# Patient Record
Sex: Male | Born: 1970 | Race: White | Hispanic: No | Marital: Married | Smoking: Former smoker
Health system: Southern US, Community
[De-identification: ages and names within clinical notes are randomized; demographics above are authoritative.]

## PROBLEM LIST (undated history)

## (undated) DIAGNOSIS — I10 Essential (primary) hypertension: Secondary | ICD-10-CM

## (undated) DIAGNOSIS — F909 Attention-deficit hyperactivity disorder, unspecified type: Secondary | ICD-10-CM

## (undated) DIAGNOSIS — Z77090 Contact with and (suspected) exposure to asbestos: Secondary | ICD-10-CM

## (undated) HISTORY — DX: Essential (primary) hypertension: I10

## (undated) HISTORY — DX: Attention-deficit hyperactivity disorder, unspecified type: F90.9

## (undated) HISTORY — DX: Contact with and (suspected) exposure to asbestos: Z77.090

---

## 2003-08-24 ENCOUNTER — Inpatient Hospital Stay (HOSPITAL_COMMUNITY): Admission: EM | Admit: 2003-08-24 | Discharge: 2003-08-26 | Payer: Self-pay | Admitting: Emergency Medicine

## 2004-11-25 HISTORY — PX: HERNIA REPAIR: SHX51

## 2008-09-03 ENCOUNTER — Emergency Department (HOSPITAL_COMMUNITY): Admission: EM | Admit: 2008-09-03 | Discharge: 2008-09-03 | Payer: Self-pay | Admitting: Emergency Medicine

## 2009-02-11 ENCOUNTER — Emergency Department (HOSPITAL_COMMUNITY): Admission: EM | Admit: 2009-02-11 | Discharge: 2009-02-11 | Payer: Self-pay | Admitting: Emergency Medicine

## 2009-07-25 ENCOUNTER — Emergency Department (HOSPITAL_COMMUNITY): Admission: EM | Admit: 2009-07-25 | Discharge: 2009-07-25 | Payer: Self-pay | Admitting: Emergency Medicine

## 2009-09-30 ENCOUNTER — Emergency Department (HOSPITAL_COMMUNITY): Admission: EM | Admit: 2009-09-30 | Discharge: 2009-09-30 | Payer: Self-pay | Admitting: Emergency Medicine

## 2009-12-28 ENCOUNTER — Emergency Department (HOSPITAL_COMMUNITY): Admission: EM | Admit: 2009-12-28 | Discharge: 2009-12-29 | Payer: Self-pay | Admitting: Emergency Medicine

## 2010-08-30 ENCOUNTER — Ambulatory Visit: Payer: Self-pay | Admitting: Diagnostic Radiology

## 2010-08-30 ENCOUNTER — Emergency Department (HOSPITAL_BASED_OUTPATIENT_CLINIC_OR_DEPARTMENT_OTHER): Admission: EM | Admit: 2010-08-30 | Discharge: 2010-08-30 | Payer: Self-pay | Admitting: Emergency Medicine

## 2010-11-12 ENCOUNTER — Emergency Department (HOSPITAL_COMMUNITY)
Admission: EM | Admit: 2010-11-12 | Discharge: 2010-11-12 | Payer: Self-pay | Source: Home / Self Care | Admitting: Emergency Medicine

## 2011-01-21 ENCOUNTER — Emergency Department (HOSPITAL_COMMUNITY): Payer: Medicaid Other

## 2011-01-21 ENCOUNTER — Inpatient Hospital Stay (HOSPITAL_COMMUNITY)
Admission: EM | Admit: 2011-01-21 | Discharge: 2011-01-25 | DRG: 203 | Disposition: A | Discharge status: Home / Self Care | Payer: Medicaid Other | Attending: Internal Medicine | Admitting: Internal Medicine

## 2011-01-21 DIAGNOSIS — R0982 Postnasal drip: Secondary | ICD-10-CM | POA: Diagnosis present

## 2011-01-21 DIAGNOSIS — D72829 Elevated white blood cell count, unspecified: Secondary | ICD-10-CM | POA: Diagnosis present

## 2011-01-21 DIAGNOSIS — T380X5A Adverse effect of glucocorticoids and synthetic analogues, initial encounter: Secondary | ICD-10-CM | POA: Diagnosis present

## 2011-01-21 DIAGNOSIS — F4310 Post-traumatic stress disorder, unspecified: Secondary | ICD-10-CM | POA: Diagnosis present

## 2011-01-21 DIAGNOSIS — F909 Attention-deficit hyperactivity disorder, unspecified type: Secondary | ICD-10-CM | POA: Diagnosis present

## 2011-01-21 DIAGNOSIS — E860 Dehydration: Secondary | ICD-10-CM | POA: Diagnosis present

## 2011-01-21 DIAGNOSIS — E669 Obesity, unspecified: Secondary | ICD-10-CM | POA: Diagnosis present

## 2011-01-21 DIAGNOSIS — J209 Acute bronchitis, unspecified: Principal | ICD-10-CM | POA: Diagnosis present

## 2011-01-21 DIAGNOSIS — F17200 Nicotine dependence, unspecified, uncomplicated: Secondary | ICD-10-CM | POA: Diagnosis present

## 2011-01-21 DIAGNOSIS — F411 Generalized anxiety disorder: Secondary | ICD-10-CM | POA: Diagnosis present

## 2011-01-21 LAB — BASIC METABOLIC PANEL
BUN: 11 mg/dL (ref 6–23)
CO2: 27 mEq/L (ref 19–32)
Calcium: 9.7 mg/dL (ref 8.4–10.5)
Chloride: 103 mEq/L (ref 96–112)
Creatinine, Ser: 0.97 mg/dL (ref 0.4–1.5)
GFR calc Af Amer: >60 mL/min (ref >60)
GFR calc non Af Amer: >60 mL/min (ref >60)
Glucose, Bld: 117 mg/dL — ABNORMAL HIGH (ref 70–99)
Potassium: 4.2 mEq/L (ref 3.5–5.1)
Sodium: 138 mEq/L (ref 135–145)

## 2011-01-21 LAB — T4: T4, Total: 8.7 ug/dL (ref 5.0–12.5)

## 2011-01-21 LAB — CBC
HCT: 44.4 % (ref 39.0–52.0)
Hemoglobin: 15.3 g/dL (ref 13.0–17.0)
MCH: 29.1 pg (ref 26.0–34.0)
MCHC: 34.5 g/dL (ref 30.0–36.0)
MCV: 84.6 fL (ref 78.0–100.0)
Platelets: 362 10*3/uL (ref 150–400)
RBC: 5.25 MIL/uL (ref 4.22–5.81)
RDW: 12.9 % (ref 11.5–15.5)
WBC: 9.4 10*3/uL (ref 4.0–10.5)

## 2011-01-21 LAB — POCT CARDIAC MARKERS
CKMB, poc: 1.6 ng/mL (ref 1.0–8.0)
Myoglobin, poc: 55.5 ng/mL (ref 12–200)
Troponin i, poc: <0.05 ng/mL (ref 0.00–0.09)

## 2011-01-21 LAB — CK TOTAL AND CKMB (NOT AT ARMC)
CK, MB: 2.3 ng/mL (ref 0.3–4.0)
CK, MB: 2.3 ng/mL (ref 0.3–4.0)
Relative Index: 1.6 (ref 0.0–2.5)
Relative Index: 1.7 (ref 0.0–2.5)
Total CK: 140 U/L (ref 7–232)
Total CK: 139 U/L (ref 7–232)

## 2011-01-21 LAB — LIPID PANEL
Cholesterol: 174 mg/dL (ref 0–200)
HDL: 47 mg/dL (ref >39)
LDL Cholesterol: 100 mg/dL — ABNORMAL HIGH (ref 0–99)
Total CHOL/HDL Ratio: 3.7 RATIO
Triglycerides: 136 mg/dL (ref <150)
VLDL: 27 mg/dL (ref 0–40)

## 2011-01-21 LAB — TROPONIN I
Troponin I: <0.01 ng/mL (ref 0.00–0.06)
Troponin I: 0.01 ng/mL (ref 0.00–0.06)

## 2011-01-21 LAB — DIFFERENTIAL
Basophils Absolute: 0.1 10*3/uL (ref 0.0–0.1)
Basophils Relative: 1 % (ref 0–1)
Eosinophils Absolute: 0.3 10*3/uL (ref 0.0–0.7)
Eosinophils Relative: 3 % (ref 0–5)
Lymphocytes Relative: 28 % (ref 12–46)
Lymphs Abs: 2.7 10*3/uL (ref 0.7–4.0)
Monocytes Absolute: 1 10*3/uL (ref 0.1–1.0)
Monocytes Relative: 10 % (ref 3–12)
Neutro Abs: 5.5 10*3/uL (ref 1.7–7.7)
Neutrophils Relative %: 59 % (ref 43–77)

## 2011-01-21 LAB — D-DIMER, QUANTITATIVE: D-Dimer, Quant: <0.22 ug/mL-FEU (ref 0.00–0.48)

## 2011-01-21 LAB — T3, FREE: T3, Free: 3.8 pg/mL (ref 2.3–4.2)

## 2011-01-21 LAB — TSH: TSH: 0.723 u[IU]/mL (ref 0.350–4.500)

## 2011-01-21 NOTE — H&P (Signed)
NAMEGEOFFRY, Miles               ACCOUNT NO.:  [id number]  MEDICAL RECORD NO.:  [id number]           PATIENT TYPE:  E  LOCATION:  [facility]                         FACILITY:  [facility]  PHYSICIAN:  Nicholas Nap, MD  DATE OF BIRTH:  Sep 14, 1971  DATE OF ADMISSION:  01/21/2011 DATE OF DISCHARGE:                             HISTORY & PHYSICAL   PRIMARY CARE PHYSICIAN:  [facility].  History obtainable from the patient and the patient's spouse.  CHIEF COMPLAINT:  Wheezing and shortness of breath on and off for about 3 week duration with nasal drip.  The patient is a 40 year old obese, very pleasant Caucasian male with history of PTSD and attention deficit disorder, presenting to the emergency room with wheezing which according to the patient had been on and off for about 3-week duration and this was said to have gotten progressively worse.  The wheezing was said to be associated with postnasal drip and shortness of breath.  The patient claimed he had cough that was nonproductive of sputum and with subjective feeling of fever but he denied any chills or rigor.  He claimed he was nauseated but denied any vomiting.  He also complained about chest tightness which is nonpleuritic.  He denied any history of PND or orthopnea.  Symptoms were said to have been getting progressively worse.  Hence the patient presented to the emergency room to be evaluated.  PAST MEDICAL HISTORY:  Positive for attention deficit disorder, obesity, PTSD.  PAST SURGICAL HISTORY:  Hernia repair.  PREADMISSION MEDICATIONS:  Adderall XR 20 mg p.o. daily, Flonase dose unknown.  ALLERGIES:  KEFLEX and PEANUTS.  SOCIAL HISTORY:  The patient smokes about 1 pack of cigarettes every other day for over 15 years.  Denies any history of alcohol use and works as an Games developer.  FAMILY HISTORY:  Positive for malignancy of unknown origin and multiple myeloma.  REVIEW OF SYSTEMS:  The patient denies  any history of headaches.  No blurry vision.  He complained of wheezing with cough that is nonproductive of sputum with associated nonpleuritic chest pain.  He had a subjective feeling of fever.  Denied any chills or rigor.  Nauseated but no vomiting.  He denies any PND, orthopnea.  Also complained about postnasal drip.  Denies any cough.  Denies any abdominal pain.  No diarrhea or hematochezia.  No dysuria or hematuria.  No swelling of the lower extremity.  No intolerance to heat or cold and no neuropsychiatric disorder but he complains about chronic pain in his lower back.  PHYSICAL EXAMINATION:  GENERAL:  An obese young man not in any obvious respiratory distress, wheezing with suboptimal hydration. PRESENT VITAL SIGNS:  Blood pressure is 122/89, pulse is 99, respiratory rate is 27, temperature 97.0. HEENT:  Pupils are reactive to light and extraocular muscles are intact. NECK:  Increasing neck circumference, but no lymphadenopathy. CHEST:  Showed minimal scattered rhonchi. CARDIAC:  Heart sounds are 1 and 2. ABDOMEN:  Obese, nontender.  Liver, spleen, and kidney not palpable. Bowel sounds are positive. EXTREMITIES:  No pedal edema. NEUROLOGIC:  Nonfocal. MUSCULOSKELETAL SYSTEM:  Unremarkable. SKIN:  Shows slightly decreased turgor.  LABORATORY DATA:  Hematologic indices showed WBC of 9.4, hemoglobin of 15.3, hematocrit of 44.4, MCV of 84.63, platelet count of 362,000 with normal differential.  First set of cardiac markers, troponin-I less than 0.05 and chemistry showed sodium of 138, potassium of 4.2, chloride of 102 with a bicarb of 27, glucose is 117, BUN is 11, creatinine is 0.97. EKG showed normal sinus rhythm with a rate of 94, no acute ST-wave change noted.  Chest x-ray was essentially normal.  ADMITTING IMPRESSION: 1. Acute bronchitis. 2. Dehydration. 3. Postnasal drip. 4. Obesity. 5. Attention deficit disorder. 6. Posttraumatic stress disorder.  The plan is to  admit the patient to general medical floor.  The patient will be on O2 via nasal cannula 3 times per minute.  He will be slowly rehydrated with half-normal saline IV to go at rate of 60 mL an hour. He will be given albuterol and Atrovent nebs q.4 hourly and Solu-Medrol 60 mg IV q.12 and he will be on Avelox 500 mg IV q.12.  Other medication to be given to the patient will include Protonix 40 mg IV q.24 for GI prophylaxis and Lovenox 40 mg subcu q.24 for DVT prophylaxis.  The patient will also be on Adderall XR 20 mg p.o. daily.  Further labs to be ordered on this patient will include cardiac enzymes q.6 x3, thyroid panel which include TSH, T3, T4, lipid panel, blood culture x2 before starting IV antibiotics.  He will have an x-ray of the lumbosacral spine done because of his chronic low back pain.  CBC, CMP, and magnesium will be repeated in a.m.  The patient will be followed and evaluated on day- to-day basis.     Nicholas Nap, MD     CN/MEDQ  D:  01/21/2011  T:  01/21/2011  Job:  [id number]  Electronically Signed by Nicholas Miles  on 01/21/2011 04:14:17 PM

## 2011-01-22 LAB — CBC
HCT: 45.2 % (ref 39.0–52.0)
Hemoglobin: 15.9 g/dL (ref 13.0–17.0)
MCHC: 35.2 g/dL (ref 30.0–36.0)
WBC: 13.8 10*3/uL — ABNORMAL HIGH (ref 4.0–10.5)

## 2011-01-22 LAB — COMPREHENSIVE METABOLIC PANEL
ALT: 21 U/L (ref 0–53)
Alkaline Phosphatase: 70 U/L (ref 39–117)
CO2: 23 mEq/L (ref 19–32)
Chloride: 104 mEq/L (ref 96–112)
GFR calc non Af Amer: >60 mL/min (ref >60)
Glucose, Bld: 147 mg/dL — ABNORMAL HIGH (ref 70–99)
Potassium: 4.5 mEq/L (ref 3.5–5.1)
Sodium: 137 mEq/L (ref 135–145)
Total Bilirubin: 0.6 mg/dL (ref 0.3–1.2)

## 2011-01-22 LAB — DIFFERENTIAL
Basophils Absolute: 0 10*3/uL (ref 0.0–0.1)
Lymphocytes Relative: 10 % — ABNORMAL LOW (ref 12–46)
Monocytes Absolute: 0.2 10*3/uL (ref 0.1–1.0)
Neutro Abs: 12.1 10*3/uL — ABNORMAL HIGH (ref 1.7–7.7)

## 2011-01-22 LAB — CK TOTAL AND CKMB (NOT AT ARMC): Total CK: 129 U/L (ref 7–232)

## 2011-01-23 LAB — BASIC METABOLIC PANEL
BUN: 13 mg/dL (ref 6–23)
CO2: 26 mEq/L (ref 19–32)
Chloride: 105 mEq/L (ref 96–112)
Creatinine, Ser: 1 mg/dL (ref 0.4–1.5)
Potassium: 4.6 mEq/L (ref 3.5–5.1)

## 2011-01-23 LAB — CBC
HCT: 43.6 % (ref 39.0–52.0)
MCH: 28.5 pg (ref 26.0–34.0)
MCV: 86.2 fL (ref 78.0–100.0)
Platelets: 421 10*3/uL — ABNORMAL HIGH (ref 150–400)
RBC: 5.06 MIL/uL (ref 4.22–5.81)

## 2011-01-25 LAB — CBC
HCT: 43.1 % (ref 39.0–52.0)
MCH: 29.1 pg (ref 26.0–34.0)
MCHC: 33.4 g/dL (ref 30.0–36.0)
RDW: 13.2 % (ref 11.5–15.5)

## 2011-01-27 LAB — CULTURE, BLOOD (ROUTINE X 2)
Culture  Setup Time: 201202272057
Culture: NO GROWTH

## 2011-01-29 ENCOUNTER — Emergency Department (HOSPITAL_COMMUNITY)
Admission: EM | Admit: 2011-01-29 | Discharge: 2011-01-29 | Disposition: A | Discharge status: Home / Self Care | Payer: Medicaid Other | Attending: Emergency Medicine | Admitting: Emergency Medicine

## 2011-01-29 ENCOUNTER — Emergency Department (HOSPITAL_COMMUNITY): Payer: Medicaid Other

## 2011-01-29 DIAGNOSIS — R0602 Shortness of breath: Secondary | ICD-10-CM | POA: Insufficient documentation

## 2011-01-29 DIAGNOSIS — R0609 Other forms of dyspnea: Secondary | ICD-10-CM | POA: Insufficient documentation

## 2011-01-29 DIAGNOSIS — Z79899 Other long term (current) drug therapy: Secondary | ICD-10-CM | POA: Insufficient documentation

## 2011-01-29 DIAGNOSIS — R05 Cough: Secondary | ICD-10-CM | POA: Insufficient documentation

## 2011-01-29 DIAGNOSIS — R0989 Other specified symptoms and signs involving the circulatory and respiratory systems: Secondary | ICD-10-CM | POA: Insufficient documentation

## 2011-01-29 DIAGNOSIS — F4310 Post-traumatic stress disorder, unspecified: Secondary | ICD-10-CM | POA: Insufficient documentation

## 2011-01-29 DIAGNOSIS — F411 Generalized anxiety disorder: Secondary | ICD-10-CM | POA: Insufficient documentation

## 2011-01-29 DIAGNOSIS — F988 Other specified behavioral and emotional disorders with onset usually occurring in childhood and adolescence: Secondary | ICD-10-CM | POA: Insufficient documentation

## 2011-01-29 DIAGNOSIS — R059 Cough, unspecified: Secondary | ICD-10-CM | POA: Insufficient documentation

## 2011-01-29 DIAGNOSIS — R062 Wheezing: Secondary | ICD-10-CM | POA: Insufficient documentation

## 2011-01-29 LAB — POCT I-STAT, CHEM 8
BUN: 15 mg/dL (ref 6–23)
Calcium, Ion: 1.09 mmol/L — ABNORMAL LOW (ref 1.12–1.32)
Chloride: 105 meq/L (ref 96–112)
Creatinine, Ser: 0.9 mg/dL (ref 0.4–1.5)
Glucose, Bld: 97 mg/dL (ref 70–99)
HCT: 45 % (ref 39.0–52.0)
Hemoglobin: 15.3 g/dL (ref 13.0–17.0)
Potassium: 3.7 mEq/L (ref 3.5–5.1)
Sodium: 138 meq/L (ref 135–145)
TCO2: 23 mmol/L (ref 0–100)

## 2011-01-29 LAB — DIFFERENTIAL
Basophils Absolute: 0 10*3/uL (ref 0.0–0.1)
Basophils Relative: 0 % (ref 0–1)
Eosinophils Absolute: 0.3 10*3/uL (ref 0.0–0.7)
Eosinophils Relative: 2 % (ref 0–5)
Lymphocytes Relative: 28 % (ref 12–46)
Monocytes Absolute: 1.6 10*3/uL — ABNORMAL HIGH (ref 0.1–1.0)

## 2011-01-29 LAB — CBC
MCHC: 33.9 g/dL (ref 30.0–36.0)
Platelets: 351 10*3/uL (ref 150–400)
RDW: 13.1 % (ref 11.5–15.5)
WBC: 15.1 10*3/uL — ABNORMAL HIGH (ref 4.0–10.5)

## 2011-02-04 LAB — URINALYSIS, ROUTINE W REFLEX MICROSCOPIC
Bilirubin Urine: NEGATIVE
Glucose, UA: NEGATIVE mg/dL
Ketones, ur: NEGATIVE mg/dL
Protein, ur: NEGATIVE mg/dL
pH: 6 (ref 5.0–8.0)

## 2011-02-04 LAB — COMPREHENSIVE METABOLIC PANEL
ALT: 20 U/L (ref 0–53)
AST: 24 U/L (ref 0–37)
Albumin: 3.6 g/dL (ref 3.5–5.2)
CO2: 25 mEq/L (ref 19–32)
Chloride: 105 mEq/L (ref 96–112)
Creatinine, Ser: 1.16 mg/dL (ref 0.4–1.5)
GFR calc Af Amer: >60 mL/min (ref >60)
GFR calc non Af Amer: >60 mL/min (ref >60)
Sodium: 138 mEq/L (ref 135–145)
Total Bilirubin: 0.3 mg/dL (ref 0.3–1.2)

## 2011-02-04 LAB — DIFFERENTIAL
Basophils Absolute: 0.1 10*3/uL (ref 0.0–0.1)
Eosinophils Absolute: 0.3 10*3/uL (ref 0.0–0.7)
Eosinophils Relative: 3 % (ref 0–5)
Lymphocytes Relative: 26 % (ref 12–46)
Lymphs Abs: 2.8 10*3/uL (ref 0.7–4.0)
Monocytes Absolute: 1.2 10*3/uL — ABNORMAL HIGH (ref 0.1–1.0)

## 2011-02-04 LAB — CBC
Hemoglobin: 15.3 g/dL (ref 13.0–17.0)
MCH: 29.3 pg (ref 26.0–34.0)
Platelets: 408 10*3/uL — ABNORMAL HIGH (ref 150–400)
RBC: 5.22 MIL/uL (ref 4.22–5.81)
WBC: 10.6 10*3/uL — ABNORMAL HIGH (ref 4.0–10.5)

## 2011-02-05 NOTE — Discharge Summary (Signed)
  NAMEADELL, PANEK               ACCOUNT NO.:  [id number]  MEDICAL RECORD NO.:  [id number]           PATIENT TYPE:  I  LOCATION:  4505                         FACILITY:  [facility]  PHYSICIAN:  Erick Blinks, MD     DATE OF BIRTH:  November 12, 1971  DATE OF ADMISSION:  01/21/2011 DATE OF DISCHARGE:  01/25/2011                              DISCHARGE SUMMARY   PRIMARY CARE PHYSICIAN:  [facility].  DISCHARGE DIAGNOSES: 1. Acute bronchitis. 2. Steroid-induced leukocytosis. 3. Attention deficit hyperactivity disorder. 4. Anxiety. 5. Obesity.  DISCHARGE MEDICATIONS: 1. Albuterol inhaler 2 puffs inhaled every 4 hours as needed. 2. Mucinex 600 mg p.o. b.i.d. 3. Prednisone taper. 4. Protonix 40 mg daily. 5. Xanax 1 mg by mouth every 8 hours as needed. 6. Adderall 20 mg 1 capsule by mouth twice daily. 7. Astelin 1 spray nasally daily. 8. Fluticasone nasal spray 50 mcg 1 spray nasally daily. 9. Ibuprofen 800 mg 1 tablet by mouth every 4 hours as needed.  ADMISSION HISTORY:  This is a 40 year old gentleman with a history of obesity, ADHD who presents to the emergency room with wheezing.  The patient had wheezing and shortness of breath as well as postnasal drip for approximately 3 weeks prior to admission.  He had a cough that was nonproductive.  He was subsequently admitted for further evaluation. For further details, please refer to the history and physical dictated by Dr. Beverly Gust on January 21, 2010.  HOSPITAL COURSE: 1. Acute bronchitis.  The patient was placed on high-dose steroids as     well as antibiotics.  He was given nebulizer treatment.  With these     measures, his respiratory status has improved.  His antibiotics     have been switched to oral and in fact, he has completed his     course.  His steroids have been switched to prednisone taper and he     will be continued on albuterol inhaler.  He is ambulating on room     air and his oxygen saturations were in  the high 90s.  We will     discharge him home today and have him follow up with the primary     care physician.  Currently, he sees physician at [facility] but wishes to find another physician of his choice. 2. The remainder of the patient's medical problems have remained     stable.  CONSULTATIONS:  None.  DIAGNOSTIC IMAGING: 1. Chest x-ray from February 27 shows negative 2-view chest. 2. X-ray of lumbar spine from February, shows no acute bony pathology.  PROCEDURES:  None.  DISCHARGE INSTRUCTIONS:  The patient should follow up with the primary care physician within the next 2 weeks.  He will be continued on a heart- healthy, low-calorie diet, conduct his activity as tolerated.     Erick Blinks, MD     JM/MEDQ  D:  01/25/2011  T:  01/25/2011  Job:  [id number]  Electronically Signed by Durward Mallard Neyah Ellerman  on 02/05/2011 06:56:25 PM

## 2011-03-02 LAB — POCT CARDIAC MARKERS: CKMB, poc: 1.7 ng/mL (ref 1.0–8.0)

## 2011-03-02 LAB — BASIC METABOLIC PANEL
BUN: 10 mg/dL (ref 6–23)
CO2: 24 mEq/L (ref 19–32)
Chloride: 106 mEq/L (ref 96–112)
Creatinine, Ser: 0.82 mg/dL (ref 0.4–1.5)
Glucose, Bld: 89 mg/dL (ref 70–99)
Potassium: 3.8 mEq/L (ref 3.5–5.1)

## 2011-03-02 LAB — CBC
HCT: 39.8 % (ref 39.0–52.0)
MCHC: 34.5 g/dL (ref 30.0–36.0)
MCV: 87 fL (ref 78.0–100.0)
Platelets: 338 10*3/uL (ref 150–400)
RDW: 13.4 % (ref 11.5–15.5)

## 2011-03-02 LAB — D-DIMER, QUANTITATIVE: D-Dimer, Quant: <0.22 ug/mL-FEU (ref 0.00–0.48)

## 2011-03-18 ENCOUNTER — Ambulatory Visit (INDEPENDENT_AMBULATORY_CARE_PROVIDER_SITE_OTHER): Payer: Medicaid Other | Admitting: Internal Medicine

## 2011-03-18 ENCOUNTER — Encounter: Payer: Self-pay | Admitting: Internal Medicine

## 2011-03-18 DIAGNOSIS — R0602 Shortness of breath: Secondary | ICD-10-CM | POA: Insufficient documentation

## 2011-03-18 DIAGNOSIS — J310 Chronic rhinitis: Secondary | ICD-10-CM | POA: Insufficient documentation

## 2011-03-18 NOTE — Assessment & Plan Note (Signed)
I emphasized that nasal steroids have no immediate benefit in terms of improving symptoms.  To help them reached the target tissue, the patient should use Afrin two puffs every 12 hours applied one min before using the nasal steroids.  Afrin should be stopped after no more than 5 days.  If the symptoms worsen, Afrin can be restarted after 5 days off of therapy to prevent rebound congestion from overuse of Afrin.  I also emphasized that in no way are nasal steroids a concern in terms of "addiction".  

## 2011-03-18 NOTE — Assessment & Plan Note (Signed)
DDX of  difficult airways managment all start with A and  include Adherence, Ace Inhibitors, Acid Reflux, Active Sinus Disease, Alpha 1 Antitripsin deficiency, Anxiety masquerading as Airways dz,  ABPA,  allergy(esp in young), Aspiration (esp in elderly), Adverse effects of DPI,  Active smokers, plus two Bs  = Bronchiectasis and Beta blocker use..and one C= CHF    Acid reflux and Active sinus dz lead the list of suspects with Anxiety a dx of exclusion but probably playing a role.  Reviewed approp diet/ meds to eliminate acid gerd completely then regroup in 2 weeks

## 2011-03-18 NOTE — Patient Instructions (Addendum)
Omeprazole 20 mg Take 30- 60 min before your first and last meals of the day and Pepcid 20 mg one at bedtime  GERD (REFLUX)  is an extremely common cause of respiratory symptoms, many times with no significant heartburn at all.    It can be treated with medication, but also with lifestyle changes including avoidance of late meals, excessive alcohol, smoking cessation, and avoid fatty foods, chocolate, peppermint, colas, red wine, and acidic juices such as orange juice.  NO MINT OR MENTHOL PRODUCTS SO NO COUGH DROPS  USE SUGARLESS CANDY INSTEAD (jolley ranchers or Stover's)  NO OIL BASED VITAMINS   Please see patient coordinator before you leave today  to schedule sinus CT   Use afrin 1-2 min before fluticasone nasal spray twice daily (but stop the afrin after 5 days)  Please schedule a follow up office visit in 2  weeks, sooner if needed

## 2011-03-18 NOTE — Progress Notes (Signed)
  Subjective:    Patient ID: Nicholas Miles, male    DOB: 04/23/1971, 40 y.o.   MRN: [id number]  HPI  39 yowm quit  Light  smoking 2009 with no respiratory problems at all  Including going up multiple flights of steps s diffulty until Feb 2012 admit [facility] 2/27 -3/2  with dx of sob and referred to pulmonary clinic by Dr Pecola Leisure.  03/18/2011 Initial pulmonary office eval for sob x room to room,  Also occurs sitting still without warning but not while sleeping assoc with hoarsenss dry mouth, sometimes feels like going to pass out.  10 lbs of wt gain since onset. Assoc nasal and ear congestion, mucoid nasal discharge.  One episode vomiting but o/w no over gerd on ppi but not timing approp with meals.  At ov presently Pt denies any significant sore throat, dysphagia, itching, sneezing,    fever, chills, sweats, unintended wt loss, pleuritic or exertional cp, hempoptysis, orthopnea pnd or leg swelling.    Also denies any obvious fluctuation of symptoms with weather or environmental changes or other aggravating or alleviating factors.     PMHx Morbid obesity   - nl TSH 01/21/11 Unexplained sob    - Echo2/27/2012  Nl lv but mild lae    - nl spirometry with symptoms 03/18/2011   Review of Systems  Constitutional: Negative for fever, chills, activity change, appetite change and unexpected weight change.  HENT: Positive for congestion, rhinorrhea, dental problem and postnasal drip. Negative for sore throat, sneezing, trouble swallowing and voice change.   Eyes: Negative for visual disturbance.  Respiratory: Positive for cough and shortness of breath. Negative for choking.   Cardiovascular: Positive for chest pain. Negative for leg swelling.  Gastrointestinal: Positive for nausea, vomiting and abdominal pain.  Genitourinary: Negative for difficulty urinating.  Musculoskeletal: Positive for arthralgias.  Skin: Negative for rash.  Psychiatric/Behavioral: Positive for behavioral problems. Negative for  confusion.       Objective:   Physical Exam    obese anxious wm very  hoarse nad    Wt 326 03/18/2011   HEENT: nl dentition, turbinates, and orophanx. Nl external ear canals without cough reflex   NECK :  without JVD/Nodes/TM/ nl carotid upstrokes bilaterally   LUNGS: no acc muscle use, clear to A and P bilaterally without cough on insp or exp maneuvers   CV:  RRR  no s3 or murmur or increase in P2, no edema   ABD:  soft and nontender with nl excursion in the supine position. No bruits or organomegaly, bowel sounds nl  MS:  warm without deformities, calf tenderness, cyanosis or clubbing  SKIN: warm and dry without lesions    NEURO:  alert, approp, no deficits     cxr wnl 01/21/11  Assessment & Plan:

## 2011-03-18 NOTE — Progress Notes (Signed)
Quick Note:  Spoke with pt and notified of results per Dr. Wert. Pt verbalized understanding and denied any questions.  ______ 

## 2011-03-20 ENCOUNTER — Encounter: Payer: Self-pay | Admitting: Internal Medicine

## 2011-03-20 ENCOUNTER — Ambulatory Visit (INDEPENDENT_AMBULATORY_CARE_PROVIDER_SITE_OTHER)
Admission: RE | Admit: 2011-03-20 | Discharge: 2011-03-20 | Disposition: A | Discharge status: Home / Self Care | Payer: Medicaid Other | Source: Ambulatory Visit | Attending: Internal Medicine | Admitting: Internal Medicine

## 2011-03-20 DIAGNOSIS — J310 Chronic rhinitis: Secondary | ICD-10-CM

## 2011-03-21 ENCOUNTER — Telehealth: Payer: Self-pay | Admitting: Internal Medicine

## 2011-03-21 MED ORDER — MOXIFLOXACIN HCL 400 MG PO TABS: 400.0000 mg | ORAL_TABLET | Freq: Every day | ORAL | Status: AC

## 2011-03-21 NOTE — Telephone Encounter (Signed)
Spoke with pt and notified of his ct sinus results.  Pt states that he is having prod cough with green/yellow nasal d/c, so sent in rx for avelox.

## 2011-03-21 NOTE — Progress Notes (Signed)
Quick Note:  Spoke with pt and notified of results per Dr. [name]. Pt verbalized understanding and denied any questions.States that he is coughing up yellow/green sputum and has neon yellow nasal d/c so will phone in rx for avelox. ______

## 2011-04-08 ENCOUNTER — Ambulatory Visit: Payer: Medicaid Other | Admitting: Internal Medicine

## 2011-04-10 ENCOUNTER — Encounter: Payer: Self-pay | Admitting: Internal Medicine

## 2011-04-10 ENCOUNTER — Ambulatory Visit (INDEPENDENT_AMBULATORY_CARE_PROVIDER_SITE_OTHER): Payer: Medicaid Other | Admitting: Internal Medicine

## 2011-04-10 VITALS — BP 132/88 | HR 98 | Temp 98.0°F | Ht 73.0 in | Wt 325.0 lb

## 2011-04-10 DIAGNOSIS — J310 Chronic rhinitis: Secondary | ICD-10-CM

## 2011-04-10 DIAGNOSIS — R0602 Shortness of breath: Secondary | ICD-10-CM

## 2011-04-10 MED ORDER — OMEPRAZOLE 20 MG PO CPDR: DELAYED_RELEASE_CAPSULE | ORAL | Status: DC

## 2011-04-10 MED ORDER — AMOXICILLIN-POT CLAVULANATE 875-125 MG PO TABS: 1.0000 | ORAL_TABLET | Freq: Two times a day (BID) | ORAL | Status: DC

## 2011-04-10 MED ORDER — MOXIFLOXACIN HCL 400 MG PO TABS: 400.0000 mg | ORAL_TABLET | Freq: Every day | ORAL | Status: AC

## 2011-04-10 MED ORDER — PREDNISONE (PAK) 10 MG PO TABS: ORAL_TABLET | ORAL | Status: AC

## 2011-04-10 MED ORDER — FAMOTIDINE 20 MG PO TABS: ORAL_TABLET | ORAL | Status: DC

## 2011-04-10 NOTE — Assessment & Plan Note (Signed)
Symptoms are markedly disproportionate to objective findings and not clear this is a lung problem but pt does appear to have difficult airway management issues.  DDX of  difficult airways managment all start with A and  include Adherence, Ace Inhibitors, Acid Reflux, Active Sinus Disease, Alpha 1 Antitripsin deficiency, Anxiety masquerading as Airways dz,  ABPA,  allergy(esp in young), Aspiration (esp in elderly), Adverse effects of DPI,  Active smokers, plus two Bs  = Bronchiectasis and Beta blocker use..and one C= CHF   ? All active sinusitis causing vcd > rx x 10 days more avelox then repeat sinus ct if not better.  ? Anxiety dx of exclusion  ? Occult asthma ? Doubt it since spirometry nl during an attack  but could do cpst with before and after bronchodilators to sort out the reproducible component of his problems

## 2011-04-10 NOTE — Patient Instructions (Addendum)
Avelox 400mg  one daily x 10 days  GERD (REFLUX)  is an extremely common cause of respiratory symptoms, many times with no significant heartburn at all.    It can be treated with medication, but also with lifestyle changes including avoidance of late meals, excessive alcohol, smoking cessation, and avoid fatty foods, chocolate, peppermint, colas, red wine, and acidic juices such as orange juice.  NO MINT OR MENTHOL PRODUCTS SO NO COUGH DROPS  USE SUGARLESS CANDY INSTEAD (jolley ranchers or Stover's)  NO OIL BASED VITAMINS   Afrin x 5 days on and 5 days off to help the fluticasone reach your sinuses  Prednisone 10 mg take  4 each am x 2 days,   2 each am x 2 days,  1 each am x2days and stop    If not 100% better after 10 days call [phone] ask Almyra Free and she'll you a sinus ct follouw   If you are satisfied with your treatment plan let your doctor know and he/she can either refill your medications or you can return here when your prescription runs out.     If in any way you are not 100% satisfied,  please tell us.  If 100% better, tell your friends!

## 2011-04-10 NOTE — Assessment & Plan Note (Signed)
I emphasized that nasal steroids have no immediate benefit in terms of improving symptoms.  To help them reached the target tissue, the patient should use Afrin two puffs every 12 hours applied one min before using the nasal steroids.  Afrin should be stopped after no more than 5 days.  If the symptoms worsen, Afrin can be restarted after 5 days off of therapy to prevent rebound congestion from overuse of Afrin.  I also emphasized that in no way are nasal steroids a concern in terms of "addiction".   Will try 10 more days of avelox then repeat sinus ct if not better and refer to ent if still positive.

## 2011-04-10 NOTE — Progress Notes (Signed)
   Subjective:    Patient ID: Nicholas Miles, male    DOB: Feb 13, 1971, 40 y.o.   MRN: [id number]  HPI  39 yowm quit  Light  smoking 2009 with no respiratory problems at all  Including going up multiple flights of steps s diffulty until Feb 2012 admit [facility] 2/27 -3/2  with dx of sob and referred to pulmonary clinic by Dr Pecola Leisure.  03/18/2011 Initial pulmonary office eval for sob x room to room,  Also occurs sitting still without warning but not while sleeping assoc with hoarsenss dry mouth, sometimes feels like going to pass out.  10 lbs of wt gain since onset. Assoc nasal and ear congestion, mucoid nasal discharge.  One episode vomiting but o/w no over gerd on ppi but not timing approp with meals  Imp:  VCD / upper airway cough syndrome ? sinud dz vs  gerd.  rec  Omeprazole 20 mg Take 30- 60 min before your first and last meals of the day and Pepcid 20 mg one at bedtime  GERD (REFLUX)   Diet  Please see patient coordinator before you leave today  to schedule sinus CT > pos L max sinusitis> 10 days Avelox  Use afrin 1-2 min before fluticasone nasal spray twice daily (but stop the afrin after 5 days)   .04/10/2011  Ov / [name]  Better but still 50% doe x sev flights and has to stop at top. Sleeping ok without nocturnal  or early am exac of resp c/o's or need for noct saba.  No cough. Pt denies any significant sore throat, dysphagia, itching, sneezing,  nasal congestion or excess/ purulent secretions,  fever, chills, sweats, unintended wt loss, pleuritic or exertional cp, hempoptysis, orthopnea pnd or leg swelling.    Also denies any obvious fluctuation of symptoms with weather or environmental changes or other aggravating or alleviating factors.           PMHx Morbid obesity   - nl TSH 01/21/11 Unexplained sob    - Echo2/27/2012  Nl lv but mild lae    - nl spirometry with symptoms 03/18/2011          Objective:   Physical Exam    obese anxious wm very  hoarse nad    Wt 326 03/18/2011 > 325  04/10/2011   HEENT: nl dentition, turbinates, and orophanx. Nl external ear canals without cough reflex   NECK :  without JVD/Nodes/TM/ nl carotid upstrokes bilaterally   LUNGS: no acc muscle use, clear to A and P bilaterally without cough on insp or exp maneuvers   CV:  RRR  no s3 or murmur or increase in P2, no edema   ABD:  soft and nontender with nl excursion in the supine position. No bruits or organomegaly, bowel sounds nl  MS:  warm without deformities, calf tenderness, cyanosis or clubbing  SKIN: warm and dry without lesions       cxr wnl 01/21/11  Assessment & Plan:

## 2011-04-12 NOTE — Consult Note (Signed)
Nicholas Miles, Nicholas Miles                           ACCOUNT NO.:  [id number]   MEDICAL RECORD NO.:  [id number]                   PATIENT TYPE:  EMS   LOCATION:  [facility]                                 FACILITY:  [facility]   PHYSICIAN:  Ollen Gross. Vernell Morgans, M.D.              DATE OF BIRTH:  1971-04-15   DATE OF CONSULTATION:  08/24/2003  DATE OF DISCHARGE:                                   CONSULTATION   HISTORY OF PRESENT ILLNESS:  Mr. Goeden is a 40 year old white male who has a  known history of umbilical hernia.  He was lifting heavy equipment at work  yesterday, strained, and developed some abdominal pain.  The pain worsened  overnight and he felt as though his umbilical hernia was stuck.  He had not  had any nausea or vomiting.  He has been passing flatus and have bowel  movements.  He has not run any fevers.  The pain was localized to his belly  button area.  He went to the emergency department where he got some pain  medicine and the hernia spontaneously reduced.   REVIEW OF SYSTEMS:  Again, he denies any nausea, vomiting, fever, chills,  chest pain, shortness of breath, diarrhea, or dysuria.  The rest of his  review of systems is unremarkable.   PAST MEDICAL HISTORY:  Significant for:  1. Constipation.  2. Umbilical hernia.   PAST SURGICAL HISTORY:  None.   MEDICATIONS:  None, except for milk of magnesia as needed.   ALLERGIES:  KEFLEX which causes a rash.   SOCIAL HISTORY:  He smokes about a packs of cigarettes ever four or five  days.  He denies any alcohol use.  He works at [employer] and [employer].   FAMILY HISTORY:  Noncontributory.   PHYSICAL EXAMINATION:  VITAL SIGNS:  Temperature 97.8 degrees, blood  pressure 124/67, pulse 71.  GENERAL APPEARANCE:  He is a well-developed, well-nourished, white male in  no acute distress.  SKIN:  Warm and dry with no jaundice.  HEENT:  Extraocular muscles are intact.  Pupils equal, round, and reactive  to light.  The sclerae  are nonicteric.  LUNGS:  Clear bilaterally.  No use of accessory respiratory muscles.  HEART:  Regular rate and rhythm with an impulse in the left chest.  ABDOMEN:  Soft and nontender.  He has a small umbilical hernia that is  easily reducible.  It is a little bit tender to manipulation, but does  reduce easily.  He has no palpable mass or hepatosplenomegaly.  He has no  signs of guarding or peritonitis.  EXTREMITIES:  No cyanosis, clubbing, or edema.  PSYCHOLOGIC:  He is alert and oriented x 3 without any evidence of a state  of anxiety or depression.   ASSESSMENT AND PLAN:  This is a 40 year old white male with a small  symptomatic, but reducible umbilical hernia.  He understands  the signs and  symptoms of incarceration.  I think now that it is reduced that he could be  discharged from the emergency department with close followup to me.  I have  recommended that he consider having this fixed in the very near future and I  have already made those arrangements with our office.  I have explained to  him in detail the risks and benefits of the operation to fix the hernia, as  well as some of the technical aspects.  He understands and wishes to  proceed.  He has our office number.  Should his pain worsen or he spike a  fever, he agrees to call us right away.  Otherwise we will call him tomorrow  to set up a time to schedule his operation.                                               Ollen Gross. Vernell Morgans, M.D.    PST/MEDQ  D:  08/24/2003  T:  08/24/2003  Job:  [id number]

## 2011-04-12 NOTE — Op Note (Signed)
Nicholas Miles, Nicholas Miles                           ACCOUNT NO.:  [id number]   MEDICAL RECORD NO.:  [id number]                   PATIENT TYPE:  INP   LOCATION:  5725                                 FACILITY:  [facility]   PHYSICIAN:  Ollen Gross. Vernell Morgans, M.D.              DATE OF BIRTH:  12-02-1970   DATE OF PROCEDURE:  08/24/2003  DATE OF DISCHARGE:  08/26/2003                                 OPERATIVE REPORT   PREOPERATIVE DIAGNOSIS:  Umbilical hernia.   POSTOPERATIVE DIAGNOSIS:  Umbilical hernia.   PROCEDURE:  Umbilical hernia repair with mesh.   SURGEON:  Ollen Gross. Carolynne Edouard, M.D.   ANESTHESIA:  General endotracheal.   DESCRIPTION OF PROCEDURE:  After informed consent was obtained, the patient  was brought to the operating room, placed in a supine position on the  operating table after having initial general anesthesia.  The patient's  abdomen was prepped with Betadine, draped in the usual sterile manner.   An elliptical type incision was made just superior to the umbilicus with a  15 blade knife. This incision was carried down through the skin and  subcutaneous tissue sharply with electrocautery.  Once the hernia sac was  identified, the hernia sac was dissected by a combination of blunt  dissection with a hemostat as well as sharp dissection with the  electrocautery until the hernia sac had been freed from the rest of the  subcutaneous tissue.  The base of the hernia sac was then also freed from  the surrounding fascia by a combination of both blunt dissection with a  hemostat and sharp dissection with the electrocautery.   Once this was accomplished, the hernia sac and its omental contents, there  did not appear to be any bowel within the hernia sac, was able to be reduced  back beneath the fascia into the abdomen.  Once this was accomplished, the  fascia appeared to be strong and healthy.  The fascial defect was closed  with interrupted #0 Prolene stitches.  A 3 x 6 piece of mesh was  then cut to  approximate the defect with a couple of centimeters of overlap on either  side.  This mesh was then anchored to the hernia repair using the tails of  the #0 Prolene.  The corners of the mesh were then anchored to the fascia  with interrupted #0 Prolene stitches.   The wound was then irrigated with copious amounts of saline.  The repair  appeared to be under no tension and in good position.  The subcutaneous  tissue was closed with interrupted 3-0 Vicryl stitches and the skin was  closed with a running 4-0 Monocryl subcuticular stitch.  Benzoin, Steri-  Strips, and sterile dressings were applied to keep the umbilicus in place.  The umbilicus was then packed with sterile cotton balls and a fluff pressure  dressing was applied to the top.   The patient  tolerated the procedure well.  At the end of the case, all  needles, sponges, instrument counts correct.  The patient was then awakened  and taken to the recovery room in stable condition.                                              Ollen Gross. Vernell Morgans, M.D.   PST/MEDQ  D:  09/06/2003  T:  09/07/2003  Job:  [id number]

## 2011-07-11 ENCOUNTER — Emergency Department (HOSPITAL_COMMUNITY): Payer: Medicaid Other

## 2011-07-11 ENCOUNTER — Emergency Department (HOSPITAL_COMMUNITY)
Admission: EM | Admit: 2011-07-11 | Discharge: 2011-07-12 | Disposition: A | Discharge status: Home / Self Care | Payer: Medicaid Other | Attending: Emergency Medicine | Admitting: Emergency Medicine

## 2011-07-11 DIAGNOSIS — R109 Unspecified abdominal pain: Secondary | ICD-10-CM | POA: Insufficient documentation

## 2011-07-11 DIAGNOSIS — Z9889 Other specified postprocedural states: Secondary | ICD-10-CM | POA: Insufficient documentation

## 2011-07-11 DIAGNOSIS — Z79899 Other long term (current) drug therapy: Secondary | ICD-10-CM | POA: Insufficient documentation

## 2011-07-11 DIAGNOSIS — F988 Other specified behavioral and emotional disorders with onset usually occurring in childhood and adolescence: Secondary | ICD-10-CM | POA: Insufficient documentation

## 2011-07-11 DIAGNOSIS — R1110 Vomiting, unspecified: Secondary | ICD-10-CM | POA: Insufficient documentation

## 2011-07-11 DIAGNOSIS — R10819 Abdominal tenderness, unspecified site: Secondary | ICD-10-CM | POA: Insufficient documentation

## 2011-07-11 DIAGNOSIS — R6883 Chills (without fever): Secondary | ICD-10-CM | POA: Insufficient documentation

## 2011-07-11 LAB — COMPREHENSIVE METABOLIC PANEL
ALT: 39 U/L (ref 0–53)
AST: 24 U/L (ref 0–37)
Calcium: 9.8 mg/dL (ref 8.4–10.5)
GFR calc Af Amer: >60 mL/min (ref >60)
Sodium: 136 mEq/L (ref 135–145)
Total Protein: 7.5 g/dL (ref 6.0–8.3)

## 2011-07-11 LAB — URINALYSIS, ROUTINE W REFLEX MICROSCOPIC
Glucose, UA: NEGATIVE mg/dL
Protein, ur: NEGATIVE mg/dL
Specific Gravity, Urine: 1.027 (ref 1.005–1.030)
Urobilinogen, UA: 0.2 mg/dL (ref 0.0–1.0)

## 2011-07-11 LAB — DIFFERENTIAL
Basophils Relative: 1 % (ref 0–1)
Eosinophils Absolute: 0.3 10*3/uL (ref 0.0–0.7)
Monocytes Absolute: 0.9 10*3/uL (ref 0.1–1.0)
Monocytes Relative: 8 % (ref 3–12)

## 2011-07-11 LAB — URINE MICROSCOPIC-ADD ON

## 2011-07-11 LAB — CBC
MCH: 29.9 pg (ref 26.0–34.0)
MCHC: 35.6 g/dL (ref 30.0–36.0)
Platelets: 365 10*3/uL (ref 150–400)
RDW: 13.2 % (ref 11.5–15.5)

## 2011-07-11 MED ORDER — IOHEXOL 300 MG/ML  SOLN
100.0000 mL | Freq: Once | INTRAMUSCULAR | Status: AC | PRN
  Administered 2011-07-11: 100 mL via INTRAVENOUS

## 2012-03-22 ENCOUNTER — Encounter (HOSPITAL_COMMUNITY): Payer: Self-pay | Admitting: Emergency Medicine

## 2012-03-22 ENCOUNTER — Emergency Department (HOSPITAL_COMMUNITY): Payer: Medicaid Other

## 2012-03-22 ENCOUNTER — Emergency Department (HOSPITAL_COMMUNITY)
Admission: EM | Admit: 2012-03-22 | Discharge: 2012-03-22 | Disposition: A | Discharge status: Home / Self Care | Payer: Medicaid Other | Attending: Emergency Medicine | Admitting: Emergency Medicine

## 2012-03-22 DIAGNOSIS — R0602 Shortness of breath: Secondary | ICD-10-CM | POA: Insufficient documentation

## 2012-03-22 DIAGNOSIS — I10 Essential (primary) hypertension: Secondary | ICD-10-CM | POA: Insufficient documentation

## 2012-03-22 DIAGNOSIS — F988 Other specified behavioral and emotional disorders with onset usually occurring in childhood and adolescence: Secondary | ICD-10-CM | POA: Insufficient documentation

## 2012-03-22 DIAGNOSIS — J45909 Unspecified asthma, uncomplicated: Secondary | ICD-10-CM | POA: Insufficient documentation

## 2012-03-22 LAB — BASIC METABOLIC PANEL
CO2: 23 mEq/L (ref 19–32)
Calcium: 9.6 mg/dL (ref 8.4–10.5)
Chloride: 104 mEq/L (ref 96–112)
Glucose, Bld: 133 mg/dL — ABNORMAL HIGH (ref 70–99)
Sodium: 138 mEq/L (ref 135–145)

## 2012-03-22 LAB — DIFFERENTIAL
Eosinophils Relative: 2 % (ref 0–5)
Lymphocytes Relative: 22 % (ref 12–46)
Lymphs Abs: 3 10*3/uL (ref 0.7–4.0)

## 2012-03-22 LAB — CBC
HCT: 42.5 % (ref 39.0–52.0)
MCV: 85.5 fL (ref 78.0–100.0)
Platelets: 350 10*3/uL (ref 150–400)
RBC: 4.97 MIL/uL (ref 4.22–5.81)
WBC: 13.6 10*3/uL — ABNORMAL HIGH (ref 4.0–10.5)

## 2012-03-22 MED ORDER — IPRATROPIUM BROMIDE 0.02 % IN SOLN
0.5000 mg | Freq: Once | RESPIRATORY_TRACT | Status: AC
  Administered 2012-03-22: 0.5 mg via RESPIRATORY_TRACT
  Filled 2012-03-22: qty 2.5

## 2012-03-22 MED ORDER — IPRATROPIUM BROMIDE 0.02 % IN SOLN
RESPIRATORY_TRACT | Status: AC
  Administered 2012-03-22: 0.5 mg via RESPIRATORY_TRACT
  Filled 2012-03-22: qty 2.5

## 2012-03-22 MED ORDER — ALBUTEROL SULFATE HFA 108 (90 BASE) MCG/ACT IN AERS: 1.0000 | INHALATION_SPRAY | Freq: Four times a day (QID) | RESPIRATORY_TRACT | Status: DC | PRN

## 2012-03-22 MED ORDER — MAGNESIUM SULFATE 40 MG/ML IJ SOLN
2.0000 g | Freq: Once | INTRAMUSCULAR | Status: AC
  Administered 2012-03-22: 2 g via INTRAVENOUS
  Filled 2012-03-22: qty 50

## 2012-03-22 MED ORDER — PREDNISONE 20 MG PO TABS
60.0000 mg | ORAL_TABLET | Freq: Once | ORAL | Status: AC
  Administered 2012-03-22: 60 mg via ORAL
  Filled 2012-03-22: qty 3

## 2012-03-22 MED ORDER — PREDNISONE 50 MG PO TABS: ORAL_TABLET | ORAL | Status: AC

## 2012-03-22 MED ORDER — ALBUTEROL SULFATE HFA 108 (90 BASE) MCG/ACT IN AERS
2.0000 | INHALATION_SPRAY | RESPIRATORY_TRACT | Status: DC | PRN
  Administered 2012-03-22: 2 via RESPIRATORY_TRACT
  Filled 2012-03-22: qty 6.7

## 2012-03-22 MED ORDER — ALBUTEROL SULFATE (5 MG/ML) 0.5% IN NEBU
INHALATION_SOLUTION | RESPIRATORY_TRACT | Status: AC
  Administered 2012-03-22: 5 mg via RESPIRATORY_TRACT
  Filled 2012-03-22: qty 1

## 2012-03-22 MED ORDER — SODIUM CHLORIDE 0.9 % IV BOLUS (SEPSIS)
1000.0000 mL | Freq: Once | INTRAVENOUS | Status: AC
  Administered 2012-03-22: 1000 mL via INTRAVENOUS

## 2012-03-22 MED ORDER — ALBUTEROL SULFATE (5 MG/ML) 0.5% IN NEBU
10.0000 mg | INHALATION_SOLUTION | Freq: Once | RESPIRATORY_TRACT | Status: AC
  Administered 2012-03-22: 10 mg via RESPIRATORY_TRACT
  Filled 2012-03-22: qty 2

## 2012-03-22 NOTE — Discharge Instructions (Signed)
Take prednisone for 5 days as directed in the mornings. Use albuterol inhaler as needed for cough and wheezing. It is very important to followup with your primary care provider for further evaluation and management of recurrent asthmatic bronchitis. Return to emergency department at any time for changing or worsening symptoms.  Asthma, Acute Bronchospasm Your exam shows you have asthma, or acute bronchospasm that acts like asthma. Bronchospasm means your air passages become narrowed. These conditions are due to inflammation and airway spasm that cause narrowing of the bronchial tubes in the lungs. This causes you to have wheezing and shortness of breath. CAUSES  Respiratory infections and allergies most often bring on these attacks. Smoking, air pollution, cold air, emotional upsets, and vigorous exercise can also bring them on.  TREATMENT   Treatment is aimed at making the narrowed airways larger. Mild asthma/bronchospasm is usually controlled with inhaled medicines. Albuterol is a common medicine that you breathe in to open spastic or narrowed airways. Some trade names for albuterol are Ventolin or Proventil. Steroid medicine is also used to reduce the inflammation when an attack is moderate or severe. Antibiotics (medications used to kill germs) are only used if a bacterial infection is present.   If you are pregnant and need to use Albuterol (Ventolin or Proventil), you can expect the baby to move more than usual shortly after the medicine is used.  HOME CARE INSTRUCTIONS   Rest.   Drink plenty of liquids. This helps the mucus to remain thin and easily coughed up. Do not use caffeine or alcohol.   Do not smoke. Avoid being exposed to second-hand smoke.   You play a critical role in keeping yourself in good health. Avoid exposure to things that cause you to wheeze. Avoid exposure to things that cause you to have breathing problems. Keep your medications up-to-date and available. Carefully  follow your doctor's treatment plan.   When pollen or pollution is bad, keep windows closed and use an air conditioner go to places with air conditioning. If you are allergic to furry pets or birds, find new homes for them or keep them outside.   Take your medicine exactly as prescribed.   Asthma requires careful medical attention. See your caregiver for follow-up as advised. If you are more than [redacted] weeks pregnant and you were prescribed any new medications, let your Obstetrician know about the visit and how you are doing. Arrange a recheck.  SEEK IMMEDIATE MEDICAL CARE IF:   You are getting worse.   You have trouble breathing. If severe, call 911.   You develop chest pain or discomfort.   You are throwing up or not drinking fluids.   You are not getting better within 24 hours.   You are coughing up yellow, green, brown, or bloody sputum.   You develop a fever over 102 F (38.9 C).   You have trouble swallowing.  MAKE SURE YOU:   Understand these instructions.   Will watch your condition.   Will get help right away if you are not doing well or get worse.  Document Released: 02/26/2007 Document Revised: 10/31/2011 Document Reviewed: 10/26/2007 [facility] Patient Information 2012 [place], [place].  Asthma Attack Prevention HOW CAN ASTHMA BE PREVENTED? Currently, there is no way to prevent asthma from starting. However, you can take steps to control the disease and prevent its symptoms after you have been diagnosed. Learn about your asthma and how to control it. Take an active role to control your asthma by working with your  caregiver to create and follow an asthma action plan. An asthma action plan guides you in taking your medicines properly, avoiding factors that make your asthma worse, tracking your level of asthma control, responding to worsening asthma, and seeking emergency care when needed. To track your asthma, keep records of your symptoms, check your peak flow number using a  peak flow meter (handheld device that shows how well air moves out of your lungs), and get regular asthma checkups.  Other ways to prevent asthma attacks include:  Use medicines as your caregiver directs.   Identify and avoid things that make your asthma worse (as much as you can).   Keep track of your asthma symptoms and level of control.   Get regular checkups for your asthma.   With your caregiver, write a detailed plan for taking medicines and managing an asthma attack. Then be sure to follow your action plan. Asthma is an ongoing condition that needs regular monitoring and treatment.   Identify and avoid asthma triggers. A number of outdoor allergens and irritants (pollen, mold, cold air, air pollution) can trigger asthma attacks. Find out what causes or makes your asthma worse, and take steps to avoid those triggers (see below).   Monitor your breathing. Learn to recognize warning signs of an attack, such as slight coughing, wheezing or shortness of breath. However, your lung function may already decrease before you notice any signs or symptoms, so regularly measure and record your peak airflow with a home peak flow meter.   Identify and treat attacks early. If you act quickly, you're less likely to have a severe attack. You will also need less medicine to control your symptoms. When your peak flow measurements decrease and alert you to an upcoming attack, take your medicine as instructed, and immediately stop any activity that may have triggered the attack. If your symptoms do not improve, get medical help.   Pay attention to increasing quick-relief inhaler use. If you find yourself relying on your quick-relief inhaler (such as albuterol), your asthma is not under control. See your caregiver about adjusting your treatment.  IDENTIFY AND CONTROL FACTORS THAT MAKE YOUR ASTHMA WORSE A number of common things can set off or make your asthma symptoms worse (asthma triggers). Keep track of your  asthma symptoms for several weeks, detailing all the environmental and emotional factors that are linked with your asthma. When you have an asthma attack, go back to your asthma diary to see which factor, or combination of factors, might have contributed to it. Once you know what these factors are, you can take steps to control many of them.  Allergies: If you have allergies and asthma, it is important to take asthma prevention steps at home. Asthma attacks (worsening of asthma symptoms) can be triggered by allergies, which can cause temporary increased inflammation of your airways. Minimizing contact with the substance to which you are allergic will help prevent an asthma attack. Animal Dander:   Some people are allergic to the flakes of skin or dried saliva from animals with fur or feathers. Keep these pets out of your home.   If you can't keep a pet outdoors, keep the pet out of your bedroom and other sleeping areas at all times, and keep the door closed.   Remove carpets and furniture covered with cloth from your home. If that is not possible, keep the pet away from fabric-covered furniture and carpets.  Dust Mites:  Many people with asthma are allergic to dust mites.  Dust mites are tiny bugs that are found in every home, in mattresses, pillows, carpets, fabric-covered furniture, bedcovers, clothes, stuffed toys, fabric, and other fabric-covered items.   Cover your mattress in a special dust-proof cover.   Cover your pillow in a special dust-proof cover, or wash the pillow each week in hot water. Water must be hotter than 130 F to kill dust mites. Cold or warm water used with detergent and bleach can also be effective.   Wash the sheets and blankets on your bed each week in hot water.   Try not to sleep or lie on cloth-covered cushions.   Call ahead when traveling and ask for a smoke-free hotel room. Bring your own bedding and pillows, in case the hotel only supplies feather pillows and  down comforters, which may contain dust mites and cause asthma symptoms.   Remove carpets from your bedroom and those laid on concrete, if you can.   Keep stuffed toys out of the bed, or wash the toys weekly in hot water or cooler water with detergent and bleach.  Cockroaches:  Many people with asthma are allergic to the droppings and remains of cockroaches.   Keep food and garbage in closed containers. Never leave food out.   Use poison baits, traps, powders, gels, or paste (for example, boric acid).   If a spray is used to kill cockroaches, stay out of the room until the odor goes away.  Indoor Mold:  Fix leaky faucets, pipes, or other sources of water that have mold around them.   Clean moldy surfaces with a cleaner that has bleach in it.  Pollen and Outdoor Mold:  When pollen or mold spore counts are high, try to keep your windows closed.   Stay indoors with windows closed from late morning to afternoon, if you can. Pollen and some mold spore counts are highest at that time.   Ask your caregiver whether you need to take or increase anti-inflammatory medicine before your allergy season starts.  Irritants:   Tobacco smoke is an irritant. If you smoke, ask your caregiver how you can quit. Ask family members to quit smoking, too. Do not allow smoking in your home or car.   If possible, do not use a wood-burning stove, kerosene heater, or fireplace. Minimize exposure to all sources of smoke, including incense, candles, fires, and fireworks.   Try to stay away from strong odors and sprays, such as perfume, talcum powder, hair spray, and paints.   Decrease humidity in your home and use an indoor air cleaning device. Reduce indoor humidity to below 60 percent. Dehumidifiers or central air conditioners can do this.   Try to have someone else vacuum for you once or twice a week, if you can. Stay out of rooms while they are being vacuumed and for a short while afterward.   If you  vacuum, use a dust mask from a hardware store, a double-layered or microfilter vacuum cleaner bag, or a vacuum cleaner with a HEPA filter.   Sulfites in foods and beverages can be irritants. Do not drink beer or wine, or eat dried fruit, processed potatoes, or shrimp if they cause asthma symptoms.   Cold air can trigger an asthma attack. Cover your nose and mouth with a scarf on cold or windy days.   Several health conditions can make asthma more difficult to manage, including runny nose, sinus infections, reflux disease, psychological stress, and sleep apnea. Your caregiver will treat these conditions, as well.  Avoid close contact with people who have a cold or the flu, since your asthma symptoms may get worse if you catch the infection from them. Wash your hands thoroughly after touching items that may have been handled by people with a respiratory infection.   Get a flu shot every year to protect against the flu virus, which often makes asthma worse for days or weeks. Also get a pneumonia shot once every five to 10 years.  Drugs:  Aspirin and other painkillers can cause asthma attacks. 10% to 20% of people with asthma have sensitivity to aspirin or a group of painkillers called non-steroidal anti-inflammatory drugs (NSAIDS), such as ibuprofen and naproxen. These drugs are used to treat pain and reduce fevers. Asthma attacks caused by any of these medicines can be severe and even fatal. These drugs must be avoided in people who have known aspirin sensitive asthma. Products with acetaminophen are considered safe for people who have asthma. It is important that people with aspirin sensitivity read labels of all over-the-counter drugs used to treat pain, colds, coughs, and fever.   Beta blockers and ACE inhibitors are other drugs which you should discuss with your caregiver, in relation to your asthma.  ALLERGY SKIN TESTING  Ask your asthma caregiver about allergy skin testing or blood testing  (RAST test) to identify the allergens to which you are sensitive. If you are found to have allergies, allergy shots (immunotherapy) for asthma may help prevent future allergies and asthma. With allergy shots, small doses of allergens (substances to which you are allergic) are injected under your skin on a regular schedule. Over a period of time, your body may become used to the allergen and less responsive with asthma symptoms. You can also take measures to minimize your exposure to those allergens. EXERCISE  If you have exercise-induced asthma, or are planning vigorous exercise, or exercise in cold, humid, or dry environments, prevent exercise-induced asthma by following your caregiver's advice regarding asthma treatment before exercising. Document Released: 10/30/2009 Document Revised: 10/31/2011 Document Reviewed: 10/30/2009 [facility] Patient Information 2012 [place], [place].

## 2012-03-22 NOTE — ED Notes (Signed)
Called resp therapy to come and give pt hr long neb tx. Report received from [facility]. Pt to come to cdu#4 when back from xray.

## 2012-03-22 NOTE — ED Provider Notes (Signed)
History     CSN: [id number]  Arrival date & time 03/22/12  5784   First MD Initiated Contact with Patient 03/22/12 [phone]      Chief Complaint  Patient presents with  . Shortness of Breath    (Consider location/radiation/quality/duration/timing/severity/associated sxs/prior treatment) HPI  Patient presents to emergency department complaining of gradual onset wheezing and cough that began yesterday. Patient states that over the last couple of days he's had some nasal congestion and postnasal drip with a mild cough however last night began to have chest tightness with increased wheezing. Patient states that he was driving to work this morning and he began to cough with increased wheezing and that he felt very "lightheaded and faint like I couldn't breathe." Patient states at that time he drove directly to the ER instead of work. Patient states that last year he had similar symptoms that ended up being diagnosed as bronchitis but required hospitalization for further workup. Patient states that after hospitalization they ended up stating that he had a "bad case of bronchitis." He states "they worked me for just about everything, even for congestive heart failure. But it ended up being bronchitis." Patient states that he has felt well since then not requiring asthma or bronchitis treatment until symptoms began yesterday. He denies recent fevers, chills, chest pain, abdominal pain, nausea, vomiting, diarrhea. He denies any known sick contacts. Patient took no medications prior to arrival for the symptoms. Symptoms are gradual onset, persistent, and worsening.  Past Medical History  Diagnosis Date  . Asbestos exposure   . Hypertension   . ADD (attention deficit disorder with hyperactivity)   . Pleurisy     Past Surgical History  Procedure Date  . Hernia repair 2006    Family History  Problem Relation Age of Onset  . Multiple myeloma Mother   . Colon cancer Father     History  Substance  Use Topics  . Smoking status: Former Smoker -- 0.3 packs/day for 7 years    Types: Cigarettes    Quit date: 11/25/2009  . Smokeless tobacco: Former Neurosurgeon    Quit date: 11/25/1988  . Alcohol Use: No      Review of Systems  All other systems reviewed and are negative.    Allergies  Keflex  Home Medications   Current Outpatient Rx  Name Route Sig Dispense Refill  . AMPHETAMINE-DEXTROAMPHET ER 30 MG PO CP24 Oral Take 30 mg by mouth every morning.    Marland Kitchen FLUTICASONE PROPIONATE 50 MCG/ACT NA SUSP Nasal 2 sprays by Nasal route daily.      Marland Kitchen OMEPRAZOLE 20 MG PO CPDR Oral Take 20 mg by mouth 2 (two) times daily. Take 30- 60 min before your first and last meals of the day      BP 126/72  Temp 97.9 F (36.6 C)  Resp 17  SpO2 99%  Physical Exam  Vitals reviewed. Constitutional: He is oriented to person, place, and time. He appears well-developed and well-nourished. No distress.  HENT:  Head: Normocephalic and atraumatic.  Right Ear: External ear normal.  Left Ear: External ear normal.  Nose: Nose normal.  Mouth/Throat: No oropharyngeal exudate.       Mild erythema of posterior pharynx and tonsils no tonsillar exudate or enlargement. Patent airway. Swallowing secretions well  Eyes: Conjunctivae are normal.  Neck: Normal range of motion. Neck supple.  Cardiovascular: Normal rate, regular rhythm and normal heart sounds.  Exam reveals no gallop and no friction rub.   No murmur  heard. Pulmonary/Chest: Effort normal. No respiratory distress. He has wheezes. He has no rales. He exhibits no tenderness.  Abdominal: Soft. He exhibits no distension and no mass. There is no tenderness. There is no rebound and no guarding.  Lymphadenopathy:    He has no cervical adenopathy.  Neurological: He is alert and oriented to person, place, and time. He has normal reflexes.  Skin: Skin is warm and dry. No rash noted. He is not diaphoretic.  Psychiatric: He has a normal mood and affect.    ED  Course  Procedures (including critical care time)  Triage nursing gave albuterol/atrovent neb for wheezing  IV fluids, magnesium, hour long neb, PO prednisone   Date: 03/22/2012  Rate: 83  Rhythm: normal sinus rhythm  QRS Axis: normal  Intervals: normal  ST/T Wave abnormalities: normal  Conduction Disutrbances: none  Narrative Interpretation: non provocative compared to January 29, 2011  Old EKG Reviewed: No significant changes noted    Labs Reviewed  CBC - Abnormal; Notable for the following:    WBC 13.6 (*)    All other components within normal limits  DIFFERENTIAL - Abnormal; Notable for the following:    Neutro Abs 9.1 (*)    Monocytes Absolute 1.1 (*)    All other components within normal limits  BASIC METABOLIC PANEL - Abnormal; Notable for the following:    Glucose, Bld 133 (*)    All other components within normal limits   Dg Chest 2 View  03/22/2012  *RADIOLOGY REPORT*  Clinical Data: Shortness of breath and wheezing.  CHEST - 2 VIEW  Comparison: 01/29/2011  Findings: No evidence of focal infiltrate, edema, pleural effusion or pulmonary nodule.  The heart size and mediastinal contours are stable.  Stable mild degenerative changes of the thoracic spine.  IMPRESSION: No active disease.  Original Report Authenticated By: Reola Calkins, M.D.     1. Asthmatic bronchitis       MDM  Question asthmatic bronchitis with improving symptoms throughout ER stay. No acute findings on xray. Pulse ox 100% on room air. Resolved wheezing. Patient states he is feeling much improved. Will send home with prednisone burst and albuterol inhaler.         Lenon Oms [place], [place] 03/22/12 [phone]

## 2012-03-22 NOTE — ED Notes (Signed)
EKG given to [place], [place]

## 2012-03-22 NOTE — ED Notes (Signed)
Patient with URI infection last few days.  Patient with shortness of breath, increasing in last two days.  Patient is speaking in fast, full sentences, cough is productive.  Audible wheezing.

## 2012-03-27 NOTE — ED Provider Notes (Signed)
Medical screening examination/treatment/procedure(s) were performed by non-physician practitioner and as supervising physician I was immediately available for consultation/collaboration.  Shelda Jakes, MD 03/27/12 [phone]

## 2012-06-16 ENCOUNTER — Other Ambulatory Visit: Payer: Self-pay | Admitting: Internal Medicine

## 2012-06-16 MED ORDER — OMEPRAZOLE 20 MG PO CPDR: 20.0000 mg | DELAYED_RELEASE_CAPSULE | Freq: Two times a day (BID) | ORAL | Status: DC

## 2012-08-15 ENCOUNTER — Encounter (HOSPITAL_COMMUNITY): Payer: Self-pay | Admitting: Cardiology

## 2012-08-15 ENCOUNTER — Emergency Department (HOSPITAL_COMMUNITY)
Admission: EM | Admit: 2012-08-15 | Discharge: 2012-08-15 | Disposition: A | Discharge status: Home / Self Care | Payer: Medicaid Other | Attending: Emergency Medicine | Admitting: Emergency Medicine

## 2012-08-15 ENCOUNTER — Emergency Department (HOSPITAL_COMMUNITY): Payer: Medicaid Other

## 2012-08-15 DIAGNOSIS — R42 Dizziness and giddiness: Secondary | ICD-10-CM | POA: Insufficient documentation

## 2012-08-15 DIAGNOSIS — M542 Cervicalgia: Secondary | ICD-10-CM | POA: Insufficient documentation

## 2012-08-15 DIAGNOSIS — R0602 Shortness of breath: Secondary | ICD-10-CM

## 2012-08-15 DIAGNOSIS — I10 Essential (primary) hypertension: Secondary | ICD-10-CM | POA: Insufficient documentation

## 2012-08-15 DIAGNOSIS — R209 Unspecified disturbances of skin sensation: Secondary | ICD-10-CM | POA: Insufficient documentation

## 2012-08-15 DIAGNOSIS — R079 Chest pain, unspecified: Secondary | ICD-10-CM | POA: Insufficient documentation

## 2012-08-15 DIAGNOSIS — F411 Generalized anxiety disorder: Secondary | ICD-10-CM | POA: Insufficient documentation

## 2012-08-15 LAB — CBC WITH DIFFERENTIAL/PLATELET
Basophils Relative: 0 % (ref 0–1)
Eosinophils Absolute: 0.2 10*3/uL (ref 0.0–0.7)
HCT: 42.1 % (ref 39.0–52.0)
Hemoglobin: 14.6 g/dL (ref 13.0–17.0)
Lymphs Abs: 3 10*3/uL (ref 0.7–4.0)
MCH: 29.6 pg (ref 26.0–34.0)
MCHC: 34.7 g/dL (ref 30.0–36.0)
Monocytes Absolute: 1.7 10*3/uL — ABNORMAL HIGH (ref 0.1–1.0)
Monocytes Relative: 12 % (ref 3–12)
Neutro Abs: 9 10*3/uL — ABNORMAL HIGH (ref 1.7–7.7)
RBC: 4.94 MIL/uL (ref 4.22–5.81)

## 2012-08-15 LAB — POCT I-STAT TROPONIN I: Troponin i, poc: 0 ng/mL (ref 0.00–0.08)

## 2012-08-15 LAB — COMPREHENSIVE METABOLIC PANEL
Albumin: 4.2 g/dL (ref 3.5–5.2)
Alkaline Phosphatase: 80 U/L (ref 39–117)
BUN: 14 mg/dL (ref 6–23)
Chloride: 101 mEq/L (ref 96–112)
Creatinine, Ser: 1.04 mg/dL (ref 0.50–1.35)
GFR calc Af Amer: >90 mL/min (ref >90)
Glucose, Bld: 89 mg/dL (ref 70–99)
Total Bilirubin: 0.2 mg/dL — ABNORMAL LOW (ref 0.3–1.2)

## 2012-08-15 MED ORDER — LORAZEPAM 1 MG PO TABS: 1.0000 mg | ORAL_TABLET | Freq: Three times a day (TID) | ORAL | Status: DC | PRN

## 2012-08-15 MED ORDER — LORAZEPAM 2 MG/ML IJ SOLN
1.0000 mg | Freq: Once | INTRAMUSCULAR | Status: AC
  Administered 2012-08-15: 1 mg via INTRAVENOUS
  Filled 2012-08-15: qty 1

## 2012-08-15 NOTE — ED Notes (Signed)
Pt has returned from being out of the department; pt placed back on monitor, continuous pulse oximetry and blood pressure cuff; family at bedside 

## 2012-08-15 NOTE — ED Provider Notes (Signed)
History     CSN: [id number]  Arrival date & time 08/15/12  4098   First MD Initiated Contact with Patient 08/15/12 1011      Chief Complaint  Patient presents with  . Chest Pain    (Consider location/radiation/quality/duration/timing/severity/associated sxs/prior treatment) HPI Hx from pt. Nicholas Miles is a 41 y.o. male with ADHD and HTN who presents to the ED with multiple complaints. States that he has had several days of intermittent chest pain which is located to the central chest and is squeezing in nature. The pain is episodic and associated with shortness of breath, tightness to the right side of his neck, and nausea. He has occasionally had tingling into the right side of his face and into his left arm associated with this. He has had some trouble with word finding when these occur. They usually last a few minutes at a time and resolve if he is able to "calm himself down." Pt reports he has no hx of the same.  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 He has additionally had some stress at home as well. He works third shift and states that this morning when coming home from work he had several episodes of sx; he had to pull over to the side and "calm himself down" in order to keep driving.  Past Medical History  Diagnosis Date  . Asbestos exposure   . Hypertension   . ADD (attention deficit disorder with hyperactivity)   . Pleurisy     Past Surgical History  Procedure Date  . Hernia repair 2006    Family History  Problem Relation Age of Onset  . Multiple myeloma Mother   . Colon cancer Father     History  Substance Use Topics  . Smoking status: Former Smoker -- 0.3 packs/day for 7 years    Types: Cigarettes    Quit date: 11/25/2009  . Smokeless tobacco: Former Neurosurgeon    Quit date: 11/25/1988  . Alcohol Use: No      Review of Systems  Constitutional: Negative for  fever and chills.  HENT: Positive for neck pain. Negative for congestion, sore throat, trouble swallowing and neck stiffness.   Eyes: Negative for photophobia and visual disturbance.  Respiratory: Positive for chest tightness. Negative for cough and wheezing.   Cardiovascular: Positive for chest pain. Negative for palpitations and leg swelling.  Gastrointestinal: Negative for nausea, vomiting and abdominal pain.  Musculoskeletal: Negative for myalgias.  Skin: Negative for color change and rash.  Neurological: Positive for speech difficulty (trouble with word finding), light-headedness and numbness. Negative for dizziness, tremors, weakness and headaches.  Psychiatric/Behavioral: Negative for confusion.  All other systems reviewed and are negative.    Allergies  Cephalexin  Home Medications   Current Outpatient Rx  Name Route Sig Dispense Refill  . FLUTICASONE PROPIONATE 50 MCG/ACT NA SUSP Nasal 2 sprays by Nasal route daily.      . METHYLPHENIDATE HCL ER 36 MG PO TBCR Oral Take 72 mg by mouth daily.    Marland Kitchen OMEPRAZOLE 20 MG PO CPDR Oral Take 1 capsule (20 mg total) by mouth 2 (two) times daily. Take 30- 60 min before your first and last meals of the day 60 capsule 5  . ALBUTEROL SULFATE HFA 108 (90 BASE) MCG/ACT IN AERS Inhalation Inhale 1-2 puffs into the lungs every 6 (six) hours as needed for wheezing. 1 Inhaler 0  .  LORAZEPAM 1 MG PO TABS Oral Take 1 tablet (1 mg total) by mouth 3 (three) times daily as needed for anxiety. 15 tablet 0    BP 137/84  Pulse 83  Temp 98.2 F (36.8 C) (Oral)  Resp 18  SpO2 98%  Physical Exam  Nursing note and vitals reviewed. Constitutional: He is oriented to person, place, and time. He appears well-developed and well-nourished. No distress.  HENT:  Head: Normocephalic and atraumatic.  Right Ear: External ear normal.  Left Ear: External ear normal.  Mouth/Throat: Oropharynx is clear and moist. No oropharyngeal exudate.  Eyes: EOM are normal.  Pupils are equal, round, and reactive to light.  Neck: Normal range of motion. Neck supple.  Cardiovascular: Normal rate, regular rhythm and normal heart sounds.   Pulmonary/Chest: Effort normal and breath sounds normal. He exhibits no tenderness.  Abdominal: Soft. Bowel sounds are normal. There is no tenderness. There is no rebound and no guarding.  Musculoskeletal: Normal range of motion. He exhibits no edema and no tenderness.  Neurological: He is alert and oriented to person, place, and time. He displays normal reflexes. No cranial nerve deficit. He exhibits normal muscle tone. Coordination normal.  Skin: Skin is warm and dry. He is not diaphoretic.  Psychiatric:       Pt seems anxious at time of assessment. Mildly rapid speech. No notable difficulty with word finding.     ED Course  Procedures (including critical care time)  Date: 08/15/2012  Rate: 95  Rhythm: normal sinus rhythm  QRS Axis: normal  Intervals: normal  ST/T Wave abnormalities: normal  Conduction Disutrbances:none  Narrative Interpretation:   Old EKG Reviewed: as compared with April 2013 no significant changes  Labs Reviewed  CBC WITH DIFFERENTIAL - Abnormal; Notable for the following:    WBC 14.0 (*)     Neutro Abs 9.0 (*)     Monocytes Absolute 1.7 (*)     All other components within normal limits  COMPREHENSIVE METABOLIC PANEL - Abnormal; Notable for the following:    Total Bilirubin 0.2 (*)     GFR calc non Af Amer 88 (*)     All other components within normal limits  POCT I-STAT TROPONIN I   Dg Chest 2 View  08/15/2012  *RADIOLOGY REPORT*  Clinical Data: Chest pain, left arm tingling  CHEST - 2 VIEW  Comparison: 03/22/2012  Findings: Cardiomediastinal silhouette is stable.  No acute infiltrate or pleural effusion.  No pulmonary edema.  Accessory azygos fissure again noted.  Bony thorax is stable.  IMPRESSION: No active disease.  No significant change.   Original Report Authenticated By: Natasha Mead, M.D.     Ct Head Wo Contrast  08/15/2012  *RADIOLOGY REPORT*  Clinical Data: Lightheadedness  CT HEAD WITHOUT CONTRAST  Technique:  Contiguous axial images were obtained from the base of the skull through the vertex without contrast.  Comparison: None.  Findings: No skull fracture is noted.  Paranasal sinuses and mastoid air cells are unremarkable.  No intracranial hemorrhage, mass effect or midline shift.  No intra or extra-axial fluid collection.  No hydrocephalus.  The gray and white matter differentiation is preserved.  No intra or extra-axial fluid collection.  No definite acute cortical infarction.  No mass lesion is noted on this unenhanced scan.  IMPRESSION: No acute intracranial abnormality.  No definite acute cortical infarction.   Original Report Authenticated By: Natasha Mead, M.D.      1. SOB (shortness of breath)  MDM  Pt presents with multiple complaints today including chest tightness and shortness of breath and tingling which is episodic in nature. Pt's exam is reassuring. He's hemodynamically stable. He is noted to be rather anxious on exam today and has been under a considerable amount of stress lately. His symptoms did improve in the dept with Ativan. Lab and imaging workup reassuring appearing. Doubt acutely life threatening cardiopulm or neuro etiology at this time. Review of previous notes indicate that he has had similar dyspnea before and was diagnosed by pulmonology with questionable asthma vs anxiety component. Reassurance given to pt at this time. Reasons to return to the ED discussed.       Grant Fontana, PA-C 08/15/12 1609

## 2012-08-15 NOTE — ED Notes (Signed)
Pt to department via EMS- reports left chest pain that started a couple of days ago and headache on the right side rates 7/10. Reports numbness and tingling to the left arm. No neuro deficits noted. Pt A&Ox4. Reports increased stress recently. Bp-177/101 HR-90

## 2012-08-19 NOTE — ED Provider Notes (Signed)
Medical screening examination/treatment/procedure(s) were performed by non-physician practitioner and as supervising physician I was immediately available for consultation/collaboration.   Gavin Pound. Jakeria Caissie, MD 08/19/12 1351

## 2014-01-20 ENCOUNTER — Emergency Department (HOSPITAL_COMMUNITY): Payer: Medicaid Other

## 2014-01-20 ENCOUNTER — Encounter (HOSPITAL_COMMUNITY): Payer: Self-pay | Admitting: Emergency Medicine

## 2014-01-20 ENCOUNTER — Emergency Department (HOSPITAL_COMMUNITY)
Admission: EM | Admit: 2014-01-20 | Discharge: 2014-01-20 | Disposition: A | Discharge status: Home / Self Care | Payer: Medicaid Other | Attending: Emergency Medicine | Admitting: Emergency Medicine

## 2014-01-20 DIAGNOSIS — Z7982 Long term (current) use of aspirin: Secondary | ICD-10-CM | POA: Insufficient documentation

## 2014-01-20 DIAGNOSIS — Z8659 Personal history of other mental and behavioral disorders: Secondary | ICD-10-CM | POA: Insufficient documentation

## 2014-01-20 DIAGNOSIS — I10 Essential (primary) hypertension: Secondary | ICD-10-CM | POA: Insufficient documentation

## 2014-01-20 DIAGNOSIS — J020 Streptococcal pharyngitis: Secondary | ICD-10-CM | POA: Insufficient documentation

## 2014-01-20 DIAGNOSIS — R059 Cough, unspecified: Secondary | ICD-10-CM

## 2014-01-20 DIAGNOSIS — Z87891 Personal history of nicotine dependence: Secondary | ICD-10-CM | POA: Insufficient documentation

## 2014-01-20 DIAGNOSIS — R05 Cough: Secondary | ICD-10-CM

## 2014-01-20 LAB — RAPID STREP SCREEN (MED CTR MEBANE ONLY): STREPTOCOCCUS, GROUP A SCREEN (DIRECT): POSITIVE — AB

## 2014-01-20 MED ORDER — AZITHROMYCIN 250 MG PO TABS: 250.0000 mg | ORAL_TABLET | Freq: Every day | ORAL | Status: DC

## 2014-01-20 MED ORDER — DEXAMETHASONE 6 MG PO TABS
10.0000 mg | ORAL_TABLET | Freq: Once | ORAL | Status: AC
  Administered 2014-01-20: 10 mg via ORAL
  Filled 2014-01-20 (×2): qty 1

## 2014-01-20 MED ORDER — PENICILLIN G BENZATHINE 1200000 UNIT/2ML IM SUSP: 1.2000 10*6.[IU] | Freq: Once | INTRAMUSCULAR | Status: DC

## 2014-01-20 NOTE — ED Notes (Signed)
Pt c/o running nose, sore throat, cough X 3 weeks. Has been taking OTC meds. Last week started to get more sinus pressure. sts he has had some fevers at home. Last night he felt sob because his throat was so sore. Hx of smoking, but doesn't any more. Nad, skin warm and dry, resp e/u.

## 2014-01-20 NOTE — ED Provider Notes (Signed)
CSN: [id number]     Arrival date & time 01/20/14  1549 History   First MD Initiated Contact with Patient 01/20/14 1552     Chief Complaint  Patient presents with  . Sore Throat     Patient is a 43 y.o. male presenting with pharyngitis.  Sore Throat This is a new problem. The current episode started 1 to 4 weeks ago. The problem occurs constantly. The problem has been gradually improving. Associated symptoms include congestion, coughing, a sore throat and swollen glands. Pertinent negatives include no abdominal pain, anorexia, arthralgias, change in bowel habit, chest pain, chills, diaphoresis, fatigue, fever, headaches, joint swelling, myalgias, nausea, neck pain, numbness, rash, urinary symptoms, vertigo, visual change, vomiting or weakness. Nothing aggravates the symptoms. He has tried acetaminophen and NSAIDs for the symptoms. The treatment provided mild relief.     Past Medical History  Diagnosis Date  . Asbestos exposure   . Hypertension   . ADD (attention deficit disorder with hyperactivity)   . Pleurisy    Past Surgical History  Procedure Laterality Date  . Hernia repair  2006   Family History  Problem Relation Age of Onset  . Multiple myeloma Mother   . Colon cancer Father    History  Substance Use Topics  . Smoking status: Former Smoker -- 0.30 packs/day for 7 years    Types: Cigarettes    Quit date: 11/25/2009  . Smokeless tobacco: Former Systems developer    Quit date: 11/25/1988  . Alcohol Use: No    Review of Systems  Constitutional: Negative for fever, chills, diaphoresis, activity change, appetite change and fatigue.  HENT: Positive for congestion and sore throat. Negative for ear pain, rhinorrhea and sinus pressure.   Eyes: Negative for pain and redness.  Respiratory: Positive for cough. Negative for chest tightness and shortness of breath.   Cardiovascular: Negative for chest pain and palpitations.  Gastrointestinal: Negative for nausea, vomiting, abdominal pain,  diarrhea, abdominal distention, anorexia and change in bowel habit.  Genitourinary: Negative for dysuria, flank pain and difficulty urinating.  Musculoskeletal: Negative for arthralgias, back pain, joint swelling, myalgias, neck pain and neck stiffness.  Skin: Negative for rash and wound.  Neurological: Negative for dizziness, vertigo, weakness, light-headedness, numbness and headaches.  Hematological: Negative for adenopathy.  Psychiatric/Behavioral: Negative for behavioral problems, confusion and agitation.      Allergies  Peanut butter flavor and Cephalexin  Home Medications   Current Outpatient Rx  Name  Route  Sig  Dispense  Refill  . aspirin 325 MG tablet   Oral   Take 975 mg by mouth 2 (two) times daily as needed for moderate pain.         . Aspirin-Acetaminophen-Caffeine (GOODY HEADACHE PO)   Oral   Take 1 Package by mouth daily as needed (for pain).         . Chlorphen-Phenyleph-APAP (TYLENOL CHILDRENS PLUS COLD) 1-2.5-160 MG/5ML SUSP   Oral   Take 5 mLs by mouth daily as needed (for cold).         Marland Kitchen guaiFENesin (ROBITUSSIN) 100 MG/5ML liquid   Oral   Take 200 mg by mouth 3 (three) times daily as needed for cough.         . Phenylephrine-Pheniramine-DM (THERAFLU COLD & COUGH) 09-14-19 MG PACK   Oral   Take 1 Package by mouth every 6 (six) hours as needed (for cold).         . pseudoephedrine (NASAL DECONGESTANT) 30 MG tablet   Oral  Take 30 mg by mouth every 4 (four) hours as needed for congestion.          BP 142/81  Pulse 93  Temp(Src) 98.1 F (36.7 C) (Oral)  Resp 18  Ht _0  (1.854 m)  Wt 300 lb (136.079 kg)  BMI 39.59 kg/m2  SpO2 95% Physical Exam  Constitutional: He is oriented to person, place, and time. He appears well-developed and well-nourished. No distress.  HENT:  Head: Normocephalic and atraumatic.  Nose: Nose normal.  Mouth/Throat: Oropharyngeal exudate ( R tonsil) present.  Eyes: Conjunctivae and EOM are normal. Pupils  are equal, round, and reactive to light.  Neck: Normal range of motion. Neck supple. No tracheal deviation present.  No neck stiffness. Painless FROM. Mild tonsillar exudates. No cervical lymphadenopathy.   Cardiovascular: Normal rate, regular rhythm, normal heart sounds and intact distal pulses.   Pulmonary/Chest: Effort normal and breath sounds normal. No respiratory distress. He has no rales.  Abdominal: Soft. Bowel sounds are normal. He exhibits no distension. There is no tenderness. There is no rebound and no guarding.  Musculoskeletal: Normal range of motion. He exhibits no edema and no tenderness.  Neurological: He is alert and oriented to person, place, and time.  Skin: Skin is warm and dry.  Psychiatric: He has a normal mood and affect. His behavior is normal.    ED Course  Procedures (including critical care time) Labs Review Labs Reviewed  RAPID STREP SCREEN - Abnormal; Notable for the following:    Streptococcus, Group A Screen (Direct) POSITIVE (*)    All other components within normal limits   Imaging Review Dg Chest 2 View  01/20/2014   CLINICAL DATA:  Cough and congestion. With reason history of nausea vomiting and fever  EXAM: CHEST  2 VIEW  COMPARISON:  DG CHEST 2 VIEW dated 08/15/2012  FINDINGS: The lungs are adequately inflated. There is an azygos lobe anatomy. There are coarse infrahilar lung markings on the right posteriorly which have developed since the previous study. The cardiac silhouette is normal in size. The pulmonary vascularity is not engorged. The mediastinum is normal in width. There is no pleural effusion. The trachea is midline. The observed portions of the bony thorax exhibit no acute abnormalities.  IMPRESSION: There is right lower lobe atelectasis or developing pneumonia. There is no evidence of CHF. Followup films following therapy would be of value to assure clearing.   Electronically Signed   By: David  Martinique   On: 01/20/2014 16:50     MDM   Final  diagnoses:  Strep pharyngitis  Cough    43 yo M in NAD AFVSS non toxic appearing. Exam with tonsillar exudates to R tonsil. No cervical lymphadenopathy and has a cough. Will send strep swab. Patient also has ttp over R maxillary sinus x 3 days. Doubt sinusitis at this time given brevity of symptoms and patient is well appearing.   4:57 PM  CXR with atelectasis vs early PNA. No rales on exam. Mild cough. Afebrile. Doubt PNA at this time. Strep swab positive. Will send home with RX of azithromycin to cover both.   Thorough discussion with patient on plan, findings, return precautions. Case co managed with my attending Dr. Aline Brochure.   Strong return precautions given.   Ruthell Rummage, MD 01/20/14 [phone]

## 2014-01-20 NOTE — Discharge Instructions (Signed)
Strep Throat  Strep throat is an infection of the throat. It is caused by a germ. Strep throat spreads from person to person by coughing, sneezing, or close contact.  HOME CARE  · Rinse your mouth (gargle) with warm salt water (1 teaspoon salt in 1 cup of water). Do this 3 to 4 times per day or as needed for comfort.  · Family members with a sore throat or fever should see a doctor.  · Make sure everyone in your house washes their hands well.  · Do not share food, drinking cups, or personal items.  · Eat soft foods until your sore throat gets better.  · Drink enough water and fluids to keep your pee (urine) clear or pale yellow.  · Rest.  · Stay home from school, daycare, or work until you have taken medicine for 24 hours.  · Only take medicine as told by your doctor.  · Take your medicine as told. Finish it even if you start to feel better.  GET HELP RIGHT AWAY IF:   · You have new problems, such as throwing up (vomiting) or bad headaches.  · You have a stiff or painful neck, chest pain, trouble breathing, or trouble swallowing.  · You have very bad throat pain, drooling, or changes in your voice.  · Your neck puffs up (swells) or gets red and tender.  · You have a fever.  · You are very tired, your mouth is dry, or you are peeing less than normal.  · You cannot wake up completely.  · You get a rash, cough, or earache.  · You have green, yellow-brown, or bloody spit.  · Your pain does not get better with medicine.  MAKE SURE YOU:   · Understand these instructions.  · Will watch your condition.  · Will get help right away if you are not doing well or get worse.  Document Released: 04/29/2008 Document Revised: 02/03/2012 Document Reviewed: 01/10/2011  [employer]® Patient Information ©2014 [employer].

## 2014-01-20 NOTE — ED Notes (Addendum)
Patient reported vomiting and diarrhea for 1 week 2 weeks ago, since that time has had runny nose and sore throat for 2 weeks.   Patient reports mild shortness of breath, air hunger intermittently for 2 weeks.

## 2014-01-21 NOTE — ED Provider Notes (Signed)
Medical screening examination/treatment/procedure(s) were conducted as a shared visit with resident physician and myself.  I personally evaluated the patient during the encounter.  I interviewed and examined the patient. Lungs are CTAB. Cardiac exam wnl. Abdomen soft.  Pt found to have strep pharyngitis as well as RLL atelectasis vs early developing pna. Home w/ Azithro to tx both.   Junius ArgyleForrest S Demitri Kucinski, MD 01/21/14 1343

## 2014-01-22 ENCOUNTER — Emergency Department (HOSPITAL_COMMUNITY)
Admission: EM | Admit: 2014-01-22 | Discharge: 2014-01-22 | Disposition: A | Discharge status: Home / Self Care | Payer: Self-pay | Attending: Emergency Medicine | Admitting: Emergency Medicine

## 2014-01-22 ENCOUNTER — Encounter (HOSPITAL_COMMUNITY): Payer: Self-pay | Admitting: Emergency Medicine

## 2014-01-22 ENCOUNTER — Emergency Department (HOSPITAL_COMMUNITY): Payer: Medicaid Other

## 2014-01-22 DIAGNOSIS — J189 Pneumonia, unspecified organism: Secondary | ICD-10-CM | POA: Insufficient documentation

## 2014-01-22 DIAGNOSIS — I10 Essential (primary) hypertension: Secondary | ICD-10-CM | POA: Insufficient documentation

## 2014-01-22 DIAGNOSIS — Z87891 Personal history of nicotine dependence: Secondary | ICD-10-CM | POA: Insufficient documentation

## 2014-01-22 DIAGNOSIS — Z8659 Personal history of other mental and behavioral disorders: Secondary | ICD-10-CM | POA: Insufficient documentation

## 2014-01-22 DIAGNOSIS — J020 Streptococcal pharyngitis: Secondary | ICD-10-CM | POA: Insufficient documentation

## 2014-01-22 DIAGNOSIS — Z7982 Long term (current) use of aspirin: Secondary | ICD-10-CM | POA: Insufficient documentation

## 2014-01-22 LAB — I-STAT CHEM 8, ED
BUN: 24 mg/dL — ABNORMAL HIGH (ref 6–23)
Calcium, Ion: 1.18 mmol/L (ref 1.12–1.23)
Chloride: 104 mEq/L (ref 96–112)
Creatinine, Ser: 1 mg/dL (ref 0.50–1.35)
GLUCOSE: 100 mg/dL — AB (ref 70–99)
HCT: 44 % (ref 39.0–52.0)
HEMOGLOBIN: 15 g/dL (ref 13.0–17.0)
Potassium: 3.6 mEq/L — ABNORMAL LOW (ref 3.7–5.3)
SODIUM: 141 meq/L (ref 137–147)
TCO2: 26 mmol/L (ref 0–100)

## 2014-01-22 LAB — CBC WITH DIFFERENTIAL/PLATELET
Basophils Absolute: 0.1 10*3/uL (ref 0.0–0.1)
Basophils Relative: 0 % (ref 0–1)
EOS ABS: 0.2 10*3/uL (ref 0.0–0.7)
EOS PCT: 1 % (ref 0–5)
HEMATOCRIT: 41.4 % (ref 39.0–52.0)
Hemoglobin: 14.3 g/dL (ref 13.0–17.0)
Lymphocytes Relative: 20 % (ref 12–46)
Lymphs Abs: 3.3 10*3/uL (ref 0.7–4.0)
MCH: 30.1 pg (ref 26.0–34.0)
MCHC: 34.5 g/dL (ref 30.0–36.0)
MCV: 87.2 fL (ref 78.0–100.0)
Monocytes Absolute: 2.3 10*3/uL — ABNORMAL HIGH (ref 0.1–1.0)
Monocytes Relative: 13 % — ABNORMAL HIGH (ref 3–12)
Neutro Abs: 11.1 10*3/uL — ABNORMAL HIGH (ref 1.7–7.7)
Neutrophils Relative %: 66 % (ref 43–77)
PLATELETS: 382 10*3/uL (ref 150–400)
RBC: 4.75 MIL/uL (ref 4.22–5.81)
RDW: 13.1 % (ref 11.5–15.5)
WBC: 16.9 10*3/uL — ABNORMAL HIGH (ref 4.0–10.5)

## 2014-01-22 MED ORDER — HYDROMORPHONE HCL PF 1 MG/ML IJ SOLN
1.0000 mg | Freq: Once | INTRAMUSCULAR | Status: AC
  Administered 2014-01-22: 1 mg via INTRAVENOUS
  Filled 2014-01-22: qty 1

## 2014-01-22 MED ORDER — ONDANSETRON HCL 4 MG/2ML IJ SOLN
4.0000 mg | Freq: Once | INTRAMUSCULAR | Status: AC
  Administered 2014-01-22: 4 mg via INTRAVENOUS
  Filled 2014-01-22: qty 2

## 2014-01-22 MED ORDER — DEXAMETHASONE SODIUM PHOSPHATE 10 MG/ML IJ SOLN
10.0000 mg | Freq: Once | INTRAMUSCULAR | Status: AC
  Administered 2014-01-22: 10 mg via INTRAVENOUS
  Filled 2014-01-22: qty 1

## 2014-01-22 MED ORDER — IOHEXOL 300 MG/ML  SOLN
75.0000 mL | Freq: Once | INTRAMUSCULAR | Status: AC | PRN
  Administered 2014-01-22: 75 mL via INTRAVENOUS

## 2014-01-22 MED ORDER — HYDROCODONE-ACETAMINOPHEN 7.5-325 MG/15ML PO SOLN: 15.0000 mL | Freq: Four times a day (QID) | ORAL | Status: AC | PRN

## 2014-01-22 NOTE — ED Notes (Signed)
Patient back from CT.

## 2014-01-22 NOTE — ED Provider Notes (Signed)
CSN: [id number]     Arrival date & time 01/22/14  4098 History  This chart was scribed for non-physician practitioner Ignacia Felling working with Wandra Arthurs, MD by Mercy Moore, ED Scribe. This patient was seen in room TR05C/TR05C and the patient's care was started at 7:39 PM.   Chief Complaint  Patient presents with  . Sore Throat      The history is provided by the patient. No language interpreter was used.   HPI Comments: Nicholas Miles is a 43 y.o. male who presents to the Emergency Department complaining of progressively worsening sore throat. Patient reports that he was diagnosed in the ED 2 days ago with strep and pneumonia and received a Z-pack and prescription for Zithromax. Patient reports that he is on the second day of his Z-pack and his symptoms are worsening. Patient reports severe lymph node swelling, ear pain, severe pain with swallowing, and difficulty talking. Patient reports that he has attempted to treat his symptoms with Aleve, Aspirin, Theraflu (several OTC medications), but has experienced no relief.   Past Medical History  Diagnosis Date  . Asbestos exposure   . Hypertension   . ADD (attention deficit disorder with hyperactivity)   . Pleurisy    Past Surgical History  Procedure Laterality Date  . Hernia repair  2006   Family History  Problem Relation Age of Onset  . Multiple myeloma Mother   . Colon cancer Father    History  Substance Use Topics  . Smoking status: Former Smoker -- 0.30 packs/day for 7 years    Types: Cigarettes    Quit date: 11/25/2009  . Smokeless tobacco: Former Systems developer    Quit date: 11/25/1988  . Alcohol Use: No    Review of Systems  HENT: Positive for sore throat.   All other systems reviewed and are negative.      Allergies  Peanut butter flavor and Cephalexin  Home Medications   Current Outpatient Rx  Name  Route  Sig  Dispense  Refill  . aspirin 325 MG tablet   Oral   Take 975 mg by mouth 2 (two) times daily as  needed for moderate pain.         . Aspirin-Acetaminophen-Caffeine (GOODY HEADACHE PO)   Oral   Take 1 Package by mouth daily as needed (for pain).         Marland Kitchen azithromycin (ZITHROMAX) 250 MG tablet   Oral   Take 1 tablet (250 mg total) by mouth daily. Take first 2 tablets together, then 1 every day until finished.   6 tablet   0   . Chlorphen-Phenyleph-APAP (TYLENOL CHILDRENS PLUS COLD) 1-2.5-160 MG/5ML SUSP   Oral   Take 5 mLs by mouth daily as needed (for cold).         Marland Kitchen guaiFENesin (ROBITUSSIN) 100 MG/5ML liquid   Oral   Take 200 mg by mouth 3 (three) times daily as needed for cough.         . Phenylephrine-Pheniramine-DM (THERAFLU COLD & COUGH) 09-14-19 MG PACK   Oral   Take 1 Package by mouth every 6 (six) hours as needed (for cold).         . pseudoephedrine (NASAL DECONGESTANT) 30 MG tablet   Oral   Take 30 mg by mouth every 4 (four) hours as needed for congestion.          BP 145/84  Pulse 98  Temp(Src) 97.6 F (36.4 C)  Resp 20  Ht _0  (1.854  m)  Wt 352 lb 9.6 oz (159.938 kg)  BMI 46.53 kg/m2  SpO2 92% Physical Exam  Nursing note and vitals reviewed. Constitutional: He is oriented to person, place, and time. He appears well-developed and well-nourished. No distress.  HENT:  Head: Normocephalic and atraumatic.  Right Ear: External ear normal.  Left Ear: External ear normal.  Nose: Nose normal.  Mouth/Throat: Oropharyngeal exudate present.  Right tonsil larger than left, no definite abscess noted.  Eyes: Conjunctivae are normal. Pupils are equal, round, and reactive to light. No scleral icterus.  Neck: Normal range of motion. Neck supple. No tracheal deviation present. No thyromegaly present.  Right anterior cervical adenopathy  Cardiovascular: Normal rate, regular rhythm and normal heart sounds.  Exam reveals no gallop and no friction rub.   No murmur heard. Pulmonary/Chest: Effort normal and breath sounds normal. No respiratory distress. He  has no wheezes. He has no rales. He exhibits no tenderness.  Abdominal: Soft. Bowel sounds are normal. He exhibits no distension. There is no tenderness.  Musculoskeletal: Normal range of motion. He exhibits no edema and no tenderness.  Lymphadenopathy:    He has cervical adenopathy.  Neurological: He is alert and oriented to person, place, and time. He exhibits normal muscle tone. Coordination normal.  Skin: Skin is warm and dry. No rash noted. No erythema. No pallor.  Psychiatric: He has a normal mood and affect. His behavior is normal. Judgment and thought content normal.    ED Course  Procedures (including critical care time) DIAGNOSTIC STUDIES: Oxygen Saturation is 92% on room air, low by my interpretation.    COORDINATION OF CARE: 7:41 PM- Will order CT of neck to assess whether patient has abscess in his right tonsil. Pt advised of plan for treatment and pt agrees.    Labs Review Labs Reviewed - No data to display Imaging Review No results found.   EKG Interpretation None     Results for orders placed during the hospital encounter of 01/22/14  CBC WITH DIFFERENTIAL      Result Value Ref Range   WBC 16.9 (*) 4.0 - 10.5 K/uL   RBC 4.75  4.22 - 5.81 MIL/uL   Hemoglobin 14.3  13.0 - 17.0 g/dL   HCT 41.4  39.0 - 52.0 %   MCV 87.2  78.0 - 100.0 fL   MCH 30.1  26.0 - 34.0 pg   MCHC 34.5  30.0 - 36.0 g/dL   RDW 13.1  11.5 - 15.5 %   Platelets 382  150 - 400 K/uL   Neutrophils Relative % 66  43 - 77 %   Neutro Abs 11.1 (*) 1.7 - 7.7 K/uL   Lymphocytes Relative 20  12 - 46 %   Lymphs Abs 3.3  0.7 - 4.0 K/uL   Monocytes Relative 13 (*) 3 - 12 %   Monocytes Absolute 2.3 (*) 0.1 - 1.0 K/uL   Eosinophils Relative 1  0 - 5 %   Eosinophils Absolute 0.2  0.0 - 0.7 K/uL   Basophils Relative 0  0 - 1 %   Basophils Absolute 0.1  0.0 - 0.1 K/uL  I-STAT CHEM 8, ED      Result Value Ref Range   Sodium 141  137 - 147 mEq/L   Potassium 3.6 (*) 3.7 - 5.3 mEq/L   Chloride 104  96 -  112 mEq/L   BUN 24 (*) 6 - 23 mg/dL   Creatinine, Ser 1.00  0.50 - 1.35 mg/dL   Glucose,  Bld 100 (*) 70 - 99 mg/dL   Calcium, Ion 1.18  1.12 - 1.23 mmol/L   TCO2 26  0 - 100 mmol/L   Hemoglobin 15.0  13.0 - 17.0 g/dL   HCT 44.0  39.0 - 52.0 %   Dg Chest 2 View  01/20/2014   CLINICAL DATA:  Cough and congestion. With reason history of nausea vomiting and fever  EXAM: CHEST  2 VIEW  COMPARISON:  DG CHEST 2 VIEW dated 08/15/2012  FINDINGS: The lungs are adequately inflated. There is an azygos lobe anatomy. There are coarse infrahilar lung markings on the right posteriorly which have developed since the previous study. The cardiac silhouette is normal in size. The pulmonary vascularity is not engorged. The mediastinum is normal in width. There is no pleural effusion. The trachea is midline. The observed portions of the bony thorax exhibit no acute abnormalities.  IMPRESSION: There is right lower lobe atelectasis or developing pneumonia. There is no evidence of CHF. Followup films following therapy would be of value to assure clearing.   Electronically Signed   By: David  Martinique   On: 01/20/2014 16:50   Ct Soft Tissue Neck W Contrast  01/22/2014   CLINICAL DATA:  Right-sided fell pain. Patient feels the throat is swollen and it hurts to swallow and cough. Diagnosed with strep from an pneumonia 2 days ago.  EXAM: CT NECK WITH CONTRAST  TECHNIQUE: Multidetector CT imaging of the neck was performed using the standard protocol following the bolus administration of intravenous contrast.  CONTRAST:  63m OMNIPAQUE IOHEXOL 300 MG/ML  SOLN  COMPARISON:  None.  FINDINGS: There is irregular thickening of the palatine tonsils extending superiorly to cause significant narrowing of the posterior inferior nasal pharyngeal airway. There is milder narrowing of the oral pharyngeal airway. There is some mild heterogeneous attenuation within the swollen palatine tonsils, more notably on the right, but there is no defined  abscess. Below the level of the palatine tonsils, the airway is widely patent. There is no other mucosal thickening or asymmetry.  There are bilateral prominent and mildly enlarged lymph nodes. The largest is a right level 2 lymph node measuring 18 mm in short axis. There is a similar position node on the left measuring 12.4 mm in short axis.  The structures of the skullbase are unremarkable. Normal globes and orbits. Visualized sinuses and mastoid air cells and middle ear cavities are clear.  Vascular structures are unremarkable.  Lung apices are clear.  IMPRESSION: 1. No evidence of a defined tonsillar or parapharyngeal abscess. 2. Irregular enlargement of the palatine tonsils, right greater than left, encroaching upon the inferior posterior nasopharyngeal airway and, to lesser degree, oral pharyngeal airway. 3. Reactive adenopathy. 4. No other abnormalities.   Electronically Signed   By: DLajean ManesM.D.   On: 01/22/2014 21:15     MDM   Strep pharyngitis  Patient here with worsening pain and sensation of right sided throat swelling, no PTA, soft tissue infection, feels better after pain medication, will plan on adding liquid pain medication to current regimen, will follow up with PCP early this week.  I personally performed the services described in this documentation, which was scribed in my presence. The recorded information has been reviewed and is accurate.    FIdalia NeedleSJoelyn Oms PVermont02/28/15 2134

## 2014-01-22 NOTE — ED Provider Notes (Signed)
Medical screening examination/treatment/procedure(s) were performed by non-physician practitioner and as supervising physician I was immediately available for consultation/collaboration.   EKG Interpretation None        [place] Kirchner H Mohsen Odenthal, MD 01/22/14 2355 

## 2014-01-22 NOTE — ED Notes (Signed)
Pt states that sore throat has become worse. Painful to swallow. States OTC medication not helping

## 2014-01-22 NOTE — Discharge Instructions (Signed)
Strep Throat °Strep throat is an infection of the throat caused by a bacteria named Streptococcus pyogenes. Your caregiver may call the infection streptococcal "tonsillitis" or "pharyngitis" depending on whether there are signs of inflammation in the tonsils or back of the throat. Strep throat is most common in children aged 44 15 years during the cold months of the year, but it can occur in people of any age during any season. This infection is spread from person to person (contagious) through coughing, sneezing, or other close contact. °SYMPTOMS  °· Fever or chills. °· Painful, swollen, red tonsils or throat. °· Pain or difficulty when swallowing. °· White or yellow spots on the tonsils or throat. °· Swollen, tender lymph nodes or "glands" of the neck or under the jaw. °· Red rash all over the body (rare). °DIAGNOSIS  °Many different infections can cause the same symptoms. A test must be done to confirm the diagnosis so the right treatment can be given. A "rapid strep test" can help your caregiver make the diagnosis in a few minutes. If this test is not available, a light swab of the infected area can be used for a throat culture test. If a throat culture test is done, results are usually available in a day or two. °TREATMENT  °Strep throat is treated with antibiotic medicine. °HOME CARE INSTRUCTIONS  °· Gargle with 1 tsp of salt in 1 cup of warm water, 3 4 times per day or as needed for comfort. °· Family members who also have a sore throat or fever should be tested for strep throat and treated with antibiotics if they have the strep infection. °· Make sure everyone in your household washes their hands well. °· Do not share food, drinking cups, or personal items that could cause the infection to spread to others. °· You may need to eat a soft food diet until your sore throat gets better. °· Drink enough water and fluids to keep your urine clear or pale yellow. This will help prevent dehydration. °· Get plenty of  rest. °· Stay home from school, daycare, or work until you have been on antibiotics for 24 hours. °· Only take over-the-counter or prescription medicines for pain, discomfort, or fever as directed by your caregiver. °· If antibiotics are prescribed, take them as directed. Finish them even if you start to feel better. °SEEK MEDICAL CARE IF:  °· The glands in your neck continue to enlarge. °· You develop a rash, cough, or earache. °· You cough up green, yellow-brown, or bloody sputum. °· You have pain or discomfort not controlled by medicines. °· Your problems seem to be getting worse rather than better. °SEEK IMMEDIATE MEDICAL CARE IF:  °· You develop any new symptoms such as vomiting, severe headache, stiff or painful neck, chest pain, shortness of breath, or trouble swallowing. °· You develop severe throat pain, drooling, or changes in your voice. °· You develop swelling of the neck, or the skin on the neck becomes red and tender. °· You have a fever. °· You develop signs of dehydration, such as fatigue, dry mouth, and decreased urination. °· You become increasingly sleepy, or you cannot wake up completely. °Document Released: 11/08/2000 Document Revised: 10/28/2012 Document Reviewed: 01/10/2011 °[employer]® Patient Information ©2014 [employer]. ° °Tonsillitis °Tonsillitis is an infection of the throat that causes the tonsils to become red, tender, and swollen. Tonsils are collections of lymphoid tissue at the back of the throat. Each tonsil has crevices (crypts). Tonsils help fight nose and   throat infections and keep infection from spreading to other parts of the body for the first 18 months of life.  CAUSES Sudden (acute) tonsillitis is usually caused by infection with streptococcal bacteria. Long-lasting (chronic) tonsillitis occurs when the crypts of the tonsils become filled with pieces of food and bacteria, which makes it easy for the tonsils to become repeatedly infected. SYMPTOMS  Symptoms of  tonsillitis include:  A sore throat, with possible difficulty swallowing.  White patches on the tonsils.  Fever.  Tiredness.  New episodes of snoring during sleep, when you did not snore before.  Small, foul-smelling, yellowish-white pieces of material (tonsilloliths) that you occasionally cough up or spit out. The tonsilloliths can also cause you to have bad breath. DIAGNOSIS Tonsillitis can be diagnosed through a physical exam. Diagnosis can be confirmed with the results of lab tests, including a throat culture. TREATMENT  The goals of tonsillitis treatment include the reduction of the severity and duration of symptoms and prevention of associated conditions. Symptoms of tonsillitis can be improved with the use of steroids to reduce the swelling. Tonsillitis caused by bacteria can be treated with antibiotics. Usually, treatment with antibiotics is started before the cause of the tonsillitis is known. However, if it is determined that the cause is not bacterial, antibiotics will not treat the tonsillitis. If attacks of tonsillitis are severe and frequent, your caregiver may recommend surgery to remove the tonsils (tonsillectomy). HOME CARE INSTRUCTIONS   Rest as much as possible and get plenty of sleep.  Drink plenty of fluids. While the throat is very sore, eat soft foods or liquids, such as sherbet, soups, or instant breakfast drinks.  Eat frozen ice pops.  Gargle with a warm or cold liquid to help soothe the throat. Mix 1/4 teaspoon of salt and 1/4 teaspoon of baking soda in in 8 oz of water. SEEK MEDICAL CARE IF:   Large, tender lumps develop in your neck.  A rash develops.  A green, yellow-brown, or bloody substance is coughed up.  You are unable to swallow liquids or food for 24 hours.  You notice that only one of the tonsils is swollen. SEEK IMMEDIATE MEDICAL CARE IF:   You develop any new symptoms such as vomiting, severe headache, stiff neck, chest pain, or trouble  breathing or swallowing.  You have severe throat pain along with drooling or voice changes.  You have severe pain, unrelieved with recommended medications.  You are unable to fully open the mouth.  You develop redness, swelling, or severe pain anywhere in the neck.  You have a fever. MAKE SURE YOU:   Understand these instructions.  Will watch your condition.  Will get help right away if you are not doing well or get worse. Document Released: 08/21/2005 Document Revised: 07/14/2013 Document Reviewed: 04/30/2013 [facility] Patient Information 2014 [place] [place], [place].

## 2014-01-26 ENCOUNTER — Emergency Department (HOSPITAL_COMMUNITY)
Admission: EM | Admit: 2014-01-26 | Discharge: 2014-01-26 | Disposition: A | Discharge status: Home / Self Care | Payer: Self-pay | Attending: Emergency Medicine | Admitting: Emergency Medicine

## 2014-01-26 ENCOUNTER — Emergency Department (HOSPITAL_COMMUNITY): Payer: Medicaid Other

## 2014-01-26 ENCOUNTER — Encounter (HOSPITAL_COMMUNITY): Payer: Self-pay | Admitting: Emergency Medicine

## 2014-01-26 DIAGNOSIS — I10 Essential (primary) hypertension: Secondary | ICD-10-CM | POA: Insufficient documentation

## 2014-01-26 DIAGNOSIS — J029 Acute pharyngitis, unspecified: Secondary | ICD-10-CM | POA: Insufficient documentation

## 2014-01-26 DIAGNOSIS — R062 Wheezing: Secondary | ICD-10-CM | POA: Insufficient documentation

## 2014-01-26 DIAGNOSIS — Z8709 Personal history of other diseases of the respiratory system: Secondary | ICD-10-CM | POA: Insufficient documentation

## 2014-01-26 DIAGNOSIS — Z8659 Personal history of other mental and behavioral disorders: Secondary | ICD-10-CM | POA: Insufficient documentation

## 2014-01-26 DIAGNOSIS — Z79899 Other long term (current) drug therapy: Secondary | ICD-10-CM | POA: Insufficient documentation

## 2014-01-26 DIAGNOSIS — R0602 Shortness of breath: Secondary | ICD-10-CM | POA: Insufficient documentation

## 2014-01-26 DIAGNOSIS — Z87891 Personal history of nicotine dependence: Secondary | ICD-10-CM | POA: Insufficient documentation

## 2014-01-26 DIAGNOSIS — M542 Cervicalgia: Secondary | ICD-10-CM | POA: Insufficient documentation

## 2014-01-26 LAB — CBC
HCT: 39.6 % (ref 39.0–52.0)
Hemoglobin: 13.4 g/dL (ref 13.0–17.0)
MCH: 29.4 pg (ref 26.0–34.0)
MCHC: 33.8 g/dL (ref 30.0–36.0)
MCV: 86.8 fL (ref 78.0–100.0)
PLATELETS: 372 10*3/uL (ref 150–400)
RBC: 4.56 MIL/uL (ref 4.22–5.81)
RDW: 12.8 % (ref 11.5–15.5)
WBC: 11.2 10*3/uL — AB (ref 4.0–10.5)

## 2014-01-26 LAB — BASIC METABOLIC PANEL
BUN: 11 mg/dL (ref 6–23)
CALCIUM: 8.7 mg/dL (ref 8.4–10.5)
CO2: 26 mEq/L (ref 19–32)
CREATININE: 0.7 mg/dL (ref 0.50–1.35)
Chloride: 102 mEq/L (ref 96–112)
GFR calc non Af Amer: >90 mL/min (ref >90)
Glucose, Bld: 102 mg/dL — ABNORMAL HIGH (ref 70–99)
Potassium: 4.3 mEq/L (ref 3.7–5.3)
Sodium: 139 mEq/L (ref 137–147)

## 2014-01-26 LAB — RAPID STREP SCREEN (MED CTR MEBANE ONLY): Streptococcus, Group A Screen (Direct): POSITIVE — AB

## 2014-01-26 MED ORDER — METHYLPREDNISOLONE SODIUM SUCC 125 MG IJ SOLR
125.0000 mg | Freq: Once | INTRAMUSCULAR | Status: AC
  Administered 2014-01-26: 125 mg via INTRAVENOUS
  Filled 2014-01-26: qty 2

## 2014-01-26 MED ORDER — HYDROMORPHONE HCL PF 1 MG/ML IJ SOLN
0.5000 mg | Freq: Once | INTRAMUSCULAR | Status: AC
  Administered 2014-01-26: 0.5 mg via INTRAVENOUS
  Filled 2014-01-26: qty 1

## 2014-01-26 MED ORDER — ACETAMINOPHEN 160 MG/5ML PO ELIX: 325.0000 mg | ORAL_SOLUTION | Freq: Four times a day (QID) | ORAL | Status: AC | PRN

## 2014-01-26 MED ORDER — ALBUTEROL SULFATE HFA 108 (90 BASE) MCG/ACT IN AERS
2.0000 | INHALATION_SPRAY | Freq: Once | RESPIRATORY_TRACT | Status: AC
  Administered 2014-01-26: 2 via RESPIRATORY_TRACT
  Filled 2014-01-26: qty 6.7

## 2014-01-26 MED ORDER — IOHEXOL 300 MG/ML  SOLN
75.0000 mL | Freq: Once | INTRAMUSCULAR | Status: AC | PRN
  Administered 2014-01-26: 75 mL via INTRAVENOUS

## 2014-01-26 MED ORDER — ALBUTEROL SULFATE HFA 108 (90 BASE) MCG/ACT IN AERS: 2.0000 | INHALATION_SPRAY | Freq: Once | RESPIRATORY_TRACT | Status: DC

## 2014-01-26 MED ORDER — SODIUM CHLORIDE 0.9 % IV SOLN
INTRAVENOUS | Status: DC
  Administered 2014-01-26: 07:00:00 via INTRAVENOUS

## 2014-01-26 NOTE — ED Provider Notes (Signed)
CSN: [id number]     Arrival date & time 01/26/14  0019 History   First MD Initiated Contact with Patient 01/26/14 [phone]     Chief Complaint  Patient presents with  . Sore Throat     (Consider location/radiation/quality/duration/timing/severity/associated sxs/prior Treatment) The history is provided by the patient. No language interpreter was used.  Nicholas Miles is a 43 year old male with past medical history of hypertension, pleurisy, ADD presenting to the ED with ongoing sore throat. Patient was seen and assessed in the ED on 01/20/2014 which he was diagnosed with pneumonia and streptococcal pharyngitis was discharged with Z-Pak. Patient was again seen in the ED on 01/22/2014 regarding worsening sore throats-CT soft tissue of neck was performed with negative findings of peritonsillar abscess - was discharged home with Hycet. Patient reported that the sore throat feels as if there is "sandpaper" with radiation to the right ear and right side of the neck. Patient reports he feels as if his teeth are being pulled out of his mouth. Stated that he's been using Z-Pak and Zithromax-finished medications yesterday. Stated that he's been using high sed, Chloraseptic Spray with minimal relief. Stated the pain worsens with swallowing secondary to pain. Reported nasal congestion leading to postnasal drip. Reported that he's been feeling intermittent fevers-has not recorded any fever secondary to not having a thermometer. Stated that he's been having shortness of breath and difficulty breathing-noted wheezing for the past 3 weeks. Denied chest pain, nausea, vomiting, diarrhea, visual changes, headache, melena, hematochezia, abdominal pain. PCP none  Past Medical History  Diagnosis Date  . Asbestos exposure   . Hypertension   . ADD (attention deficit disorder with hyperactivity)   . Pleurisy    Past Surgical History  Procedure Laterality Date  . Hernia repair  2006   Family History  Problem Relation Age  of Onset  . Multiple myeloma Mother   . Colon cancer Father    History  Substance Use Topics  . Smoking status: Former Smoker -- 0.30 packs/day for 7 years    Types: Cigarettes    Quit date: 11/25/2009  . Smokeless tobacco: Former Systems developer    Quit date: 11/25/1988  . Alcohol Use: No    Review of Systems  Constitutional: Positive for fever (Subjective). Negative for chills.  HENT: Positive for congestion, ear pain, sore throat and trouble swallowing (Secondary to pain). Negative for dental problem and ear discharge.   Respiratory: Positive for cough and shortness of breath.   Cardiovascular: Negative for chest pain.  Gastrointestinal: Negative for nausea, vomiting, abdominal pain and diarrhea.  Musculoskeletal: Positive for neck pain (Right-sided).  All other systems reviewed and are negative.      Allergies  Peanut butter flavor; Peanut-containing drug products; and Cephalexin  Home Medications   Current Outpatient Rx  Name  Route  Sig  Dispense  Refill  . Ascorbic Acid (VITAMIN C) 1000 MG tablet   Oral   Take 1,000 mg by mouth daily.         . Aspirin-Acetaminophen-Caffeine (GOODY HEADACHE PO)   Oral   Take 1 packet by mouth daily as needed (for pain).          Marland Kitchen azithromycin (ZITHROMAX) 250 MG tablet   Oral   Take 250-500 mg by mouth daily. 5 day course started 01/21/14: take 2 tablets (500 mg) 1st day, then take 1 tablet (250 mg) every day for 4 days         . Chlorphen-Pseudoephed-APAP (THERAFLU FLU/COLD PO)   Oral  Take 1 packet by mouth every 6 (six) hours as needed (cold/flu symptoms).         . EPINEPHrine (EPIPEN) 0.3 mg/0.3 mL SOAJ injection   Intramuscular   Inject 0.3 mg into the muscle as needed (allergic reaction).         Marland Kitchen HYDROcodone-acetaminophen (HYCET) 7.5-325 mg/15 ml solution   Oral   Take 15 mLs by mouth 4 (four) times daily as needed for moderate pain.   120 mL   0   . ibuprofen (ADVIL,MOTRIN) 200 MG tablet   Oral   Take  200-400 mg by mouth every 4 (four) hours as needed (pain).         . Multiple Vitamin (MULTIVITAMIN WITH MINERALS) TABS tablet   Oral   Take 1 tablet by mouth daily.         Marland Kitchen ZINC SULFATE PO   Oral   Take 1 tablet by mouth daily.         Marland Kitchen acetaminophen (TYLENOL) 160 MG/5ML elixir   Oral   Take 10.2 mLs (325 mg total) by mouth every 6 (six) hours as needed for pain.   120 mL   0    BP 147/88  Pulse 81  Temp(Src) 97.6 F (36.4 C) (Oral)  Resp 18  Ht '6\' 1"'  (1.854 m)  Wt 350 lb (158.759 kg)  BMI 46.19 kg/m2  SpO2 96% Physical Exam  Nursing note and vitals reviewed. Constitutional: He is oriented to person, place, and time. He appears well-developed and well-nourished. No distress.  HENT:  Head: Normocephalic and atraumatic.  Right Ear: Hearing, tympanic membrane, external ear and ear canal normal. No drainage, swelling or tenderness. Tympanic membrane is not injected, not scarred, not perforated, not erythematous, not retracted and not bulging.  Left Ear: Hearing, tympanic membrane, external ear and ear canal normal. No drainage, swelling or tenderness. Tympanic membrane is not injected, not scarred, not perforated, not erythematous, not retracted and not bulging.  Mouth/Throat: Uvula is midline, oropharynx is clear and moist and mucous membranes are normal. No trismus in the jaw. No dental abscesses or uvula swelling. No oropharyngeal exudate, posterior oropharyngeal edema or tonsillar abscesses.  Mild right tonsillar adenopathy with negative exudate noted. Negative exudate or swelling to the posterior oropharynx. Negative petechiae noted. Uvula midline, symmetrical elevation. Negative trismus.  Eyes: Conjunctivae and EOM are normal. Pupils are equal, round, and reactive to light. Right eye exhibits no discharge. Left eye exhibits no discharge.  Negative nystagmus  Neck: Normal range of motion. Neck supple. No tracheal deviation present.    Positive right sided tonsillar  adenopathy Discomfort upon palpation to the right musculature of the neck  Negative neck stiffness Negative nuchal rigidity  Cardiovascular: Normal rate, regular rhythm and normal heart sounds.   Pulses:      Radial pulses are 2+ on the right side, and 2+ on the left side.       Dorsalis pedis pulses are 2+ on the right side, and 2+ on the left side.  Pulmonary/Chest: Effort normal. No respiratory distress. He has wheezes (Right upper, middle and lower lobe wheezes, expiratory). He has no rales. He exhibits no tenderness.  Negative use of accessory muscles Patient is able to speak in full sentences without difficulty Negative stridor Airway intact  Musculoskeletal: Normal range of motion.  Full ROM to upper and lower extremities without difficulty noted, negative ataxia noted.  Lymphadenopathy:    He has cervical adenopathy.  Neurological: He is alert and oriented to  person, place, and time. He exhibits normal muscle tone. Coordination normal.  Skin: Skin is warm and dry. No rash noted. He is not diaphoretic. No erythema.  Psychiatric: He has a normal mood and affect. His behavior is normal. Thought content normal.    ED Course  Procedures (including critical care time)  8:54 AM This provider spoke with the patient and wife at bedside - discussed results and imaging in great detail. Discussed with patient plan for discharge. While this provider was walking out of the room patient requested more pain medications to be administered before discharge. This provider agreed to one dose prior to discharge, but patient is to be discharged. Patient requesting refill of albuterol inhaler since he ran out-reported that inhaler will be refilled. Patient agreed.  Results for orders placed during the hospital encounter of 01/26/14  RAPID STREP SCREEN      Result Value Ref Range   Streptococcus, Group A Screen (Direct) POSITIVE (*) NEGATIVE  CBC      Result Value Ref Range   WBC 11.2 (*) 4.0 -  10.5 K/uL   RBC 4.56  4.22 - 5.81 MIL/uL   Hemoglobin 13.4  13.0 - 17.0 g/dL   HCT 39.6  39.0 - 52.0 %   MCV 86.8  78.0 - 100.0 fL   MCH 29.4  26.0 - 34.0 pg   MCHC 33.8  30.0 - 36.0 g/dL   RDW 12.8  11.5 - 15.5 %   Platelets 372  150 - 400 K/uL  BASIC METABOLIC PANEL      Result Value Ref Range   Sodium 139  137 - 147 mEq/L   Potassium 4.3  3.7 - 5.3 mEq/L   Chloride 102  96 - 112 mEq/L   CO2 26  19 - 32 mEq/L   Glucose, Bld 102 (*) 70 - 99 mg/dL   BUN 11  6 - 23 mg/dL   Creatinine, Ser 0.70  0.50 - 1.35 mg/dL   Calcium 8.7  8.4 - 10.5 mg/dL   GFR calc non Af Amer >90  >90 mL/min   GFR calc Af Amer >90  >90 mL/min   Dg Chest 2 View  01/26/2014   CLINICAL DATA:  Sore throat.  Chest pain.  EXAM: CHEST  2 VIEW  COMPARISON:  01/20/2014  FINDINGS: Normal heart size and mediastinal contours. No acute infiltrate or edema. No effusion or pneumothorax. No acute osseous findings.  IMPRESSION: No active cardiopulmonary disease.   Electronically Signed   By: Jorje Guild M.D.   On: 01/26/2014 07:14    Ct Soft Tissue Neck W Contrast  01/26/2014   CLINICAL DATA:  Sore throat, right tonsillar swelling, shortness of breath while trying to sleep  EXAM: CT NECK WITH CONTRAST  TECHNIQUE: Multidetector CT imaging of the neck was performed using the standard protocol following the bolus administration of intravenous contrast.  CONTRAST:  37m OMNIPAQUE IOHEXOL 300 MG/ML  SOLN  COMPARISON:  01/22/2014  FINDINGS: Mild enlargement of the bilateral palatine tonsils, right greater than left. Minimal phlegmonous change is possible within the right palatine tonsil (series 2/image 24), but there is no discrete fluid collection/abscess.  Oropharyngeal airway is narrowed but patent.  Prominent bilateral cervical lymph nodes measuring up to 15 mm short axis on the right, likely reactive.  Visualized thyroid is unremarkable.  Cervical spine is within normal limits.  Visualized lung apices are notable for an azygos lobe  but are otherwise unremarkable.  Visualized paranasal sinuses and mastoid air cells  are clear. Visualized brain parenchyma is within normal limits.  IMPRESSION: Mild enlargement of the bilateral palatine tonsils, right greater than left. No evidence of peritonsillar abscess.  No interval change from recent CT.   Electronically Signed   By: Julian Hy M.D.   On: 01/26/2014 07:37    Labs Review Labs Reviewed  RAPID STREP SCREEN - Abnormal; Notable for the following:    Streptococcus, Group A Screen (Direct) POSITIVE (*)    All other components within normal limits  CBC - Abnormal; Notable for the following:    WBC 11.2 (*)    All other components within normal limits  BASIC METABOLIC PANEL - Abnormal; Notable for the following:    Glucose, Bld 102 (*)    All other components within normal limits   Imaging Review Dg Chest 2 View  01/26/2014   CLINICAL DATA:  Sore throat.  Chest pain.  EXAM: CHEST  2 VIEW  COMPARISON:  01/20/2014  FINDINGS: Normal heart size and mediastinal contours. No acute infiltrate or edema. No effusion or pneumothorax. No acute osseous findings.  IMPRESSION: No active cardiopulmonary disease.   Electronically Signed   By: Jorje Guild M.D.   On: 01/26/2014 07:14   Ct Soft Tissue Neck W Contrast  01/26/2014   CLINICAL DATA:  Sore throat, right tonsillar swelling, shortness of breath while trying to sleep  EXAM: CT NECK WITH CONTRAST  TECHNIQUE: Multidetector CT imaging of the neck was performed using the standard protocol following the bolus administration of intravenous contrast.  CONTRAST:  39m OMNIPAQUE IOHEXOL 300 MG/ML  SOLN  COMPARISON:  01/22/2014  FINDINGS: Mild enlargement of the bilateral palatine tonsils, right greater than left. Minimal phlegmonous change is possible within the right palatine tonsil (series 2/image 24), but there is no discrete fluid collection/abscess.  Oropharyngeal airway is narrowed but patent.  Prominent bilateral cervical lymph nodes  measuring up to 15 mm short axis on the right, likely reactive.  Visualized thyroid is unremarkable.  Cervical spine is within normal limits.  Visualized lung apices are notable for an azygos lobe but are otherwise unremarkable.  Visualized paranasal sinuses and mastoid air cells are clear. Visualized brain parenchyma is within normal limits.  IMPRESSION: Mild enlargement of the bilateral palatine tonsils, right greater than left. No evidence of peritonsillar abscess.  No interval change from recent CT.   Electronically Signed   By: SJulian HyM.D.   On: 01/26/2014 07:37     EKG Interpretation None      MDM   Final diagnoses:  Sore throat   Medications  0.9 %  sodium chloride infusion ( Intravenous New Bag/Given 01/26/14 0644)  albuterol (PROVENTIL HFA;VENTOLIN HFA) 108 (90 BASE) MCG/ACT inhaler 2 puff (not administered)  HYDROmorphone (DILAUDID) injection 0.5 mg (not administered)  HYDROmorphone (DILAUDID) injection 0.5 mg (0.5 mg Intravenous Given 01/26/14 0648)  iohexol (OMNIPAQUE) 300 MG/ML solution 75 mL (75 mLs Intravenous Contrast Given 01/26/14 0710)  methylPREDNISolone sodium succinate (SOLU-MEDROL) 125 mg/2 mL injection 125 mg (125 mg Intravenous Given 01/26/14 0846)   Filed Vitals:   01/26/14 0500 01/26/14 0600 01/26/14 0630 01/26/14 0742  BP: 141/76 114/66 151/88 147/88  Pulse: 71 66 77 81  Temp:      TempSrc:      Resp:    18  Height:      Weight:      SpO2: 97% 96% 96% 96%    Patient presenting to the ED with worsening sore throats since being diagnosed with streptococcal  pharyngitis on 01/20/2014. Stated that he has finished his antibiotics, finished the medications yesterday. Stated that the pain has gone progressively worse. Has been using cough syrup, Chloraseptic sprays, Hycet with minimal relief. Patient reported that he's been having right tonsillar pain described as "sandpaper" with radiation to the right side of the face and right ear and right side of the neck.  Stated that he is unable to swallow secondary to intense pain. Reported wheezing for the past 3 weeks-denies any medication use. Alert and oriented. GCS 15. Heart rate and rhythm normal. Radial pulses 2+ bilaterally. Lungs noted to have expiratory wheezes localized to the right upper, middle, lower lobes. Airway intact-patient is able to speak in full sentences without difficulty. Negative stridor. Uvula midline with negative swelling-symmetrical elevation. Mild right tonsillar adenopathy identified with negative findings of exudate or petechiae. Negative petechiae noted to soft palate. Negative trismus. Discomfort upon palpation to right tonsillar region-positive adenopathy noted-soft, mobile, tender upon palpation. Negative neck stiffness or nuchal rigidity noted. Mild discomfort upon palpation to musculature of the right side of the neck. Mild elevated white cell count of 11.2 - decreased when compared to blood cell count of 16.9 on 01/22/2014. BMP negative findings. Chest x-ray negative for acute cardiopulmonary disease. CT soft tissue neck noted mild enlargement of bilateral palatine tonsils with right greater than left-negative evidence for peritonsillar abscess. Strep positive. Patient presenting to the ED with sore throat. Negative findings of peritonsillar abscess. Pneumonia has cleared. Negative findings of exudate or petechiae noted to the oropharynx or tonsils. Mild right-sided adenopathy of the tonsil noted. Pain controlled in ED setting with IV medications. Patient stable, afebrile. Discharged patient. Upon discharge patient requested to be discharge with Hycet - discussed with patient that Tylenol will aid the patient. Patient requested albuterol inhaler for asthma - this provider administered albuterol inhaler. Discharged patient with acetaminophen liquid. Discussed with patient to rest and stay hydrated. Referred patient to ear nose and throat physician. Discussed with patient to closely monitor  symptoms and if symptoms are to worsen or change to report back to the ED - strict return instructions given.  Patient agreed to plan of care, understood, all questions answered.   Jamse Mead, PA-C 01/26/14 2130

## 2014-01-26 NOTE — Discharge Instructions (Signed)
Please call and set-up an appointment with Ear, Nose, and Throat physician regarding sore throat Please rest and stay hydrated Please take medications as prescribed Please use inhaler as needed for wheezing Please avoid any physical or strenuous activity Please continue to monitor symptoms and if symptoms are to worsen or change (fever greater than 101, chills, chest pain, shortness of breath, difficulty breathing, inability to breathe, inability to keep food or fluids down, worsening or changes to symptoms, nausea, vomiting, bloody stools, coughing up blood) please report back to the ED immediately  Sore Throat A sore throat is pain, burning, irritation, or scratchiness of the throat. There is often pain or tenderness when swallowing or talking. A sore throat may be accompanied by other symptoms, such as coughing, sneezing, fever, and swollen neck glands. A sore throat is often the first sign of another sickness, such as a cold, flu, strep throat, or mononucleosis (commonly known as mono). Most sore throats go away without medical treatment. CAUSES  The most common causes of a sore throat include:  A viral infection, such as a cold, flu, or mono.  A bacterial infection, such as strep throat, tonsillitis, or whooping cough.  Seasonal allergies.  Dryness in the air.  Irritants, such as smoke or pollution.  Gastroesophageal reflux disease (GERD). HOME CARE INSTRUCTIONS   Only take over-the-counter medicines as directed by your caregiver.  Drink enough fluids to keep your urine clear or pale yellow.  Rest as needed.  Try using throat sprays, lozenges, or sucking on hard candy to ease any pain (if older than 4 years or as directed).  Sip warm liquids, such as broth, herbal tea, or warm water with honey to relieve pain temporarily. You may also eat or drink cold or frozen liquids such as frozen ice pops.  Gargle with salt water (mix 1 tsp salt with 8 oz of water).  Do not smoke and  avoid secondhand smoke.  Put a cool-mist humidifier in your bedroom at night to moisten the air. You can also turn on a hot shower and sit in the bathroom with the door closed for 5 10 minutes. SEEK IMMEDIATE MEDICAL CARE IF:  You have difficulty breathing.  You are unable to swallow fluids, soft foods, or your saliva.  You have increased swelling in the throat.  Your sore throat does not get better in 7 days.  You have nausea and vomiting.  You have a fever or persistent symptoms for more than 2 3 days.  You have a fever and your symptoms suddenly get worse. MAKE SURE YOU:   Understand these instructions.  Will watch your condition.  Will get help right away if you are not doing well or get worse. Document Released: 12/19/2004 Document Revised: 10/28/2012 Document Reviewed: 07/19/2012 [facility] Patient Information 2014 [place], [place].   Emergency Department Resource Guide 1) Find a Doctor and Pay Out of Pocket Although you won't have to find out who is covered by your insurance plan, it is a good idea to ask around and get recommendations. You will then need to call the office and see if the doctor you have chosen will accept you as a new patient and what types of options they offer for patients who are self-pay. Some doctors offer discounts or will set up payment plans for their patients who do not have insurance, but you will need to ask so you aren't surprised when you get to your appointment.  2) Contact Your Local Health Department Not all health  departments have doctors that can see patients for sick visits, but many do, so it is worth a call to see if yours does. If you don't know where your local health department is, you can check in your phone book. The CDC also has a tool to help you locate your state's health department, and many state websites also have listings of all of their local health departments.  3) Find a Walk-in Clinic If your illness is not likely to be  very severe or complicated, you may want to try a walk in clinic. These are popping up all over the country in pharmacies, drugstores, and shopping centers. They're usually staffed by nurse practitioners or physician assistants that have been trained to treat common illnesses and complaints. They're usually fairly quick and inexpensive. However, if you have serious medical issues or chronic medical problems, these are probably not your best option.  No Primary Care Doctor: - Call Health Connect at  [phone] - they can help you locate a primary care doctor that  accepts your insurance, provides certain services, etc. - Physician Referral Service- [phone]  Chronic Pain Problems: Organization         Address  Phone   Notes  [facility]  [phone] Patients need to be referred by their primary care doctor.   Medication Assistance: Organization         Address  Phone   Notes  [facility] Medication [facility] [address] [place] [place], [place] [address] [phone] --Must be a resident of [facility] -- Must have NO insurance coverage whatsoever (no Medicaid/ Medicare, etc.) -- The pt. MUST have a primary care doctor that directs their care regularly and follows them in the community   [facility]  [phone]   [employer]  [phone]    Agencies that provide inexpensive medical care: Organization         Address  Phone   Notes  [facility]  [phone]   [facility]    [phone]   [facility] [address] [place], [place] [address] [phone]   [facility] [address] [place] [place] [phone]   Planned Parenthood    [phone]   [facility]    [phone]   [facility]  [address], [place] Phone:  [phone], Fax:  [phone] Hours of Operation:  9 am - 6 pm, M-F.  Also  accepts Medicaid/Medicare and self-pay.  [facility]  [address], [place] Phone: [phone], Fax: [phone]. Hours of Operation:  8:30 am - 5:30 pm, M-F.  Also accepts Medicaid and self-pay.  [facility] [place] [address] [place] Phone: [phone]   [facility] [address] Natasha Bence [place], [place] [phone], Ext. [phone] Mondays & Thursdays: 7-9 AM.  First 15 patients are seen on a first come, first serve basis.    Medicaid-accepting [facility] Providers:  Organization         Address  Phone   Notes  [facility] [address], [place] [phone] Also accepts self-pay patients.  [facility] [address] Laurell Josephs [place], [place]  [phone]   [facility] [address], [place] [phone]   [facility] [address] [place] [phone]   Renaye Rakers [address], [place]   [phone] Only accepts [place] Access [place] patients after they have their name applied to their card.   Self-Pay (no insurance) in [facility]:  Organization         Address  Phone   Notes  Sickle Cell Patients, [facility] [address] [place], [place] [phone]   [facility] [address] [place], [place] [phone]   [facility] [place]  [address], [place] [phone]   [facility]/Dr. Osei-Bonsu  [address] [place] or [address], [place] [phone] Phone number for both [place] and [place] locations is the same.  [facility] [address], [place] [phone]   [facility] [address] [place] or [address] [phone]   [facility] [address]  [place] [phone], phone; [phone], fax Sees patients 1st and 3rd Saturday of every month.  Must not qualify for public or private insurance (i.e. Medicaid, Medicare, [place] Health Choice, Veterans' Benefits)  Household income should be no more than 200% of the poverty level The clinic cannot treat you if you are pregnant or think you are pregnant  Sexually transmitted diseases are not treated at the clinic.    Dental Care: Organization         Address  Phone  Notes  [facility] of [facility] [address] [place], [place] [phone] Accepts children up to age 7 who are enrolled in [place] or [place] Health Choice; pregnant women with a Medicaid card; and children who have applied for Medicaid or [place] Health Choice, but were declined, whose parents can pay a reduced fee at time of service.  [facility] Department of [facility]  [address], [place] [phone] Accepts children up to age 65 who are enrolled in [place] or [place] Health Choice; pregnant women with a Medicaid card; and children who have applied for Medicaid or [place] Health Choice, but were declined, whose parents can pay a reduced fee at time of service.  [facility]  [address] [place], [place] [phone] Patients are seen by appointment only. Walk-ins are not accepted. [facility] will see patients 56 years of age and older. Monday - Tuesday (8am-5pm) Most Wednesdays (8:30-5pm) $30 per visit, cash only  [facility]  [address], [facility] [phone] Patients are seen by appointment only. Walk-ins are not accepted. [facility] will see patients 59 years of age and older. One Wednesday Evening (Monthly: Volunteer Based).  $30 per visit, cash only  [employer]  [phone] for adults; Children under age 51, call Graduate Pediatric Dentistry at [phone].  Children aged 46-14, please call [phone] to request a pediatric application.  Dental services are provided in all areas of dental care including fillings, crowns and bridges, complete and partial dentures, implants, gum treatment, root canals, and extractions. Preventive care is also provided. Treatment is provided to both adults and children. Patients are selected via a lottery and there is often a waiting list.   [facility] [address] [place]  [phone] [email or web address]   [facility] [address] [place], [place] [phone], Ext. [phone] Second and Fourth Thursday of each month, opens at 6:30 AM; Clinic ends at 9 AM.  Patients are seen on a first-come first-served basis, and a limited number are seen during each clinic.   [facility]  [address] Ether Griffins [place], [place] [phone]   Eligibility Requirements You must have lived in [place], [place], or [place] counties for at least the last three months.   You cannot be eligible for state or federal sponsored [place], including [employer], [place], or [employer].   You generally cannot be eligible for healthcare insurance through your employer.    How to apply: Eligibility screenings are held every Tuesday and Wednesday afternoon from 1:00 pm until 4:00 pm. You do not need an appointment for the interview!  [facility] [address], [place], [place] [phone]   [facility] Health Department  [phone]   [facility]  [phone]   [facility] Health Department  [phone]    Behavioral Health Resources in the Community: Intensive Outpatient Programs Organization         Address  Phone  Notes  [facility] [address] [place], [place] [phone]   [facility] Outpatient [address], [place], [place] [phone]   [facility] [address] [place], [place]  [phone]   [facility] Mental Health [address]  [place], [place] [phone] or [phone]   Substance Abuse Resources Organization         Address  Phone  Notes  [facility]  [phone]   [facility]  [phone]   [facility]  [phone]   Floydene Flock  [phone]   Residential & Outpatient Substance Abuse Program  [phone]   Psychological Services Organization         Address  Phone  Notes  [facility] Behavioral Health  [phone]   [facility]  [phone]   [facility] Health [address] [place] [phone] or [phone]    Mobile Crisis Teams Organization         Address  Phone  Notes  [facility], Mobile Crisis Care Unit  [phone]   [facility]  [address] [place], [place] [phone]   Doristine Locks [address] [place] [phone]    Self-Help/Support Groups Organization         Address  Phone             Notes  [facility] [place] - variety of support groups  [phone]- [phone] Call for more information  Narcotics Anonymous (NA), [facility] [address], [employer] [place]  2 meetings at this location   Statistician         Address  Phone  Notes  [facility] [address],    [place]  [phone]   [facility]  [address], [place] [address], [place], [place] [phone]   [facility] [address] [place], [place] [phone] Admissions: 8am-3pm M-F  [facility] [address]    [place], [place] [phone]   [facility] [address] Starling Manns [place], [place] [phone]   [facility] [address],  [place], [place] [phone]   [facility] - Intensive Outpatient [phone] Laurell Joseph[address],  [place], [place] [phone]     [facility] ([facility] [address]  [place], [place] [phone] or [phone]   [facility] ([facility]) [address] [place], [place] [phone] Accepts Medicaid  [facility] [address]  [place] [phone] Substance Abuse/Addiction Treatment   [facility] Behavioral Health Resources Organization         Address  Phone  Notes  [facility]  [phone]   Angie FavaJulie Brannon, PhD [address] [place], [place]   [phone]) [phone] or [phone](336) [phone]   [facility]   [address] [place], [place] [phone]) [phone]   [facility] [address], [place], [place] [phone]) [phone] Insurance/Medicaid/sponsorship through [facility]  [facility] [address]                                    [place], [place] [phone]) [phone] Therapy/tele-psych/case  [facility] [address]   [place], [place] [phone]) [phone]    Dr. Lolly MustacheArfeen  [phone]   [facility]  [employer] [facility] 1) [address], [place] 2) [address], [place] 3)  [address], [place] [phone]) [phone]  [phone](336) [phone]   [facility] Child Abuse Hotline [phone]) [phone] or [phone]) [phone] (After Hours)

## 2014-01-26 NOTE — ED Notes (Signed)
Pt reports he was prescribed a z pack for his strep throat and he finished taking them last night and was compliant with meds. Reports he does not think it helped at all.

## 2014-01-26 NOTE — ED Notes (Signed)
Patient presents with c/o sore throat.  States his right tonsil is swollen, becomes SOB when trying to sleep.

## 2014-01-29 NOTE — ED Provider Notes (Signed)
Medical screening examination/treatment/procedure(s) were performed by non-physician practitioner and as supervising physician I was immediately available for consultation/collaboration.   EKG Interpretation None        Junius ArgyleForrest S Cheridan Kibler, MD 01/29/14 [phone]

## 2014-03-13 IMAGING — CR DG CHEST 2V
2 series · 2 of 2 positions shown · non-contrast
Comparison: 01/20/2014

CLINICAL DATA: Sore throat.  Chest pain.

EXAM:
CHEST  2 VIEW

[w chest pa]
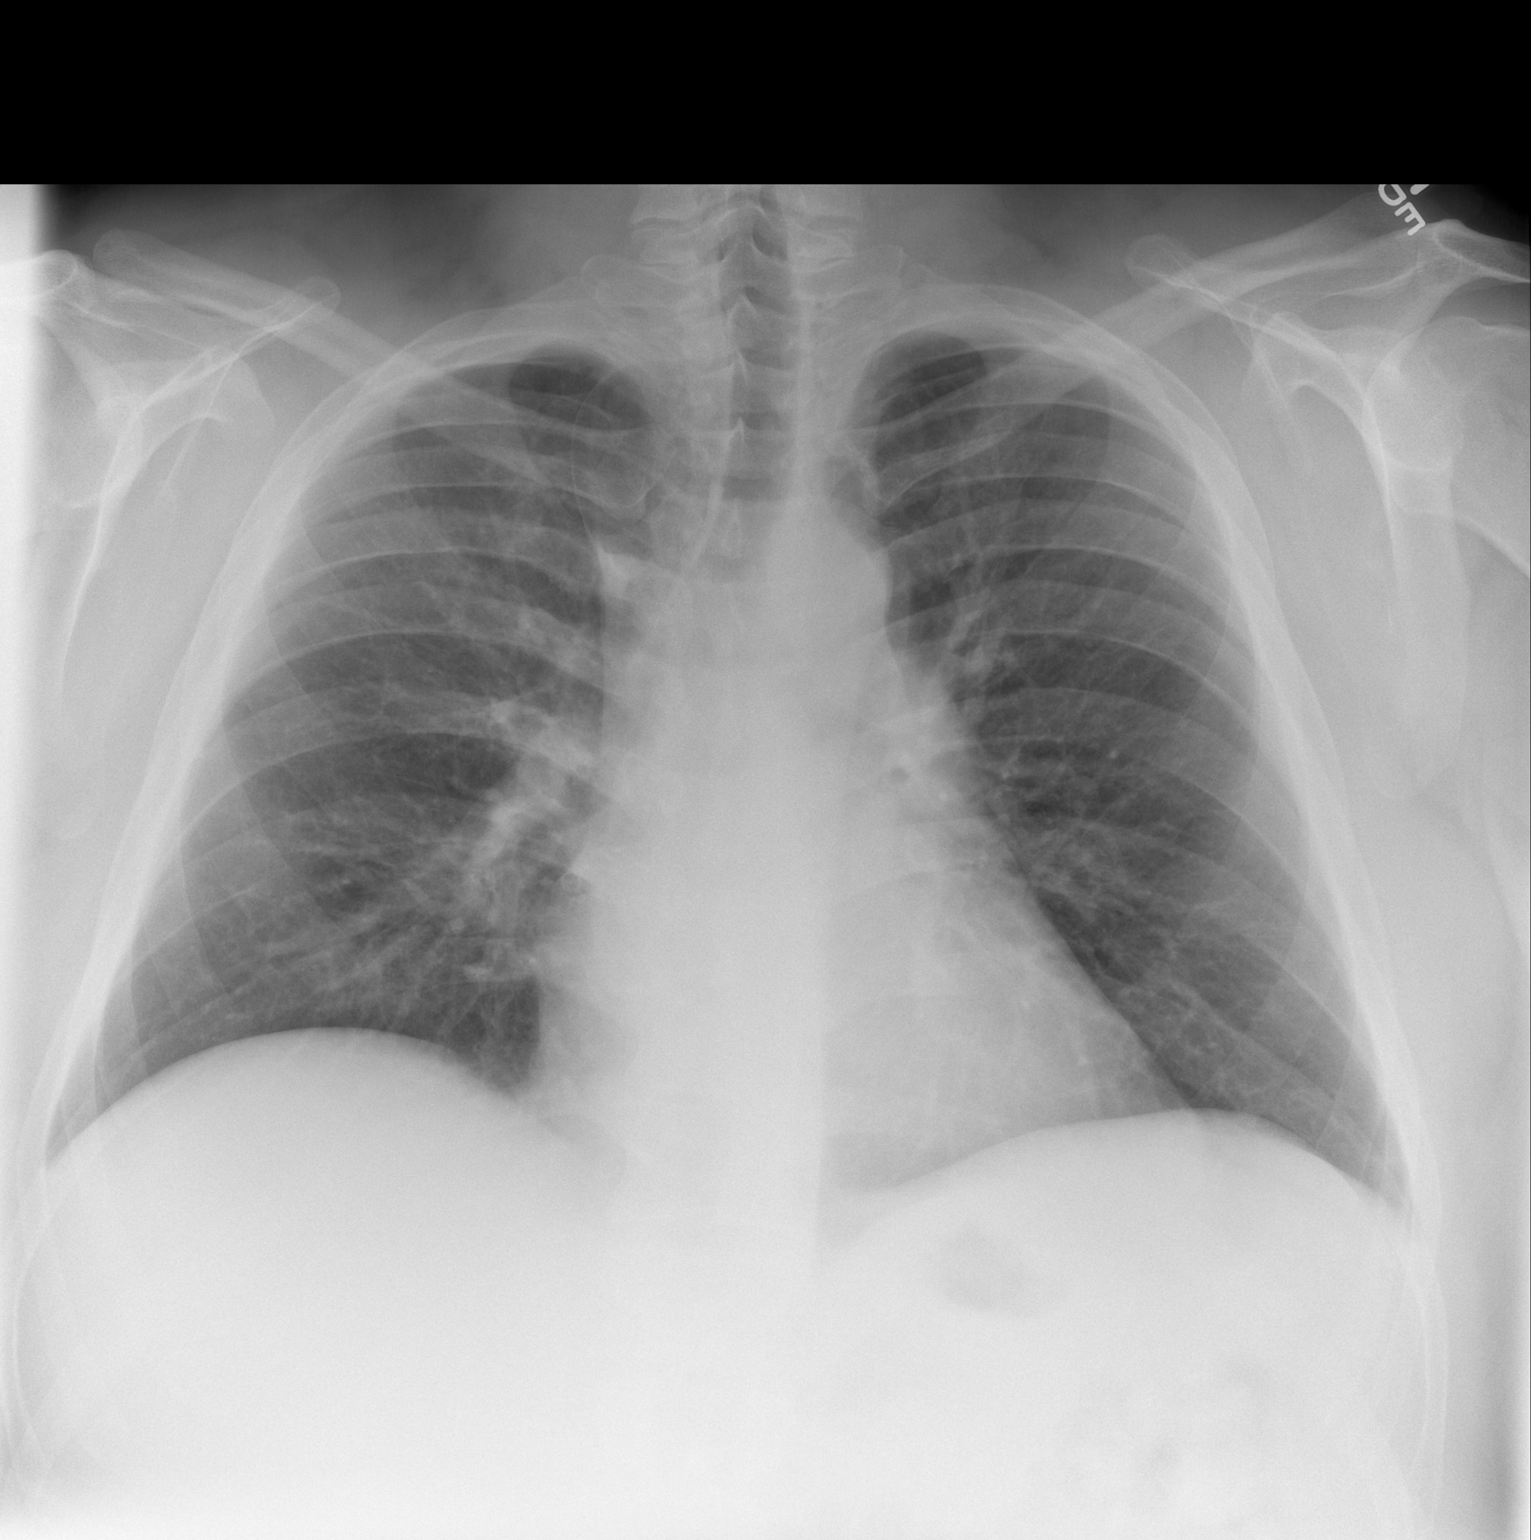

[w chest lat]
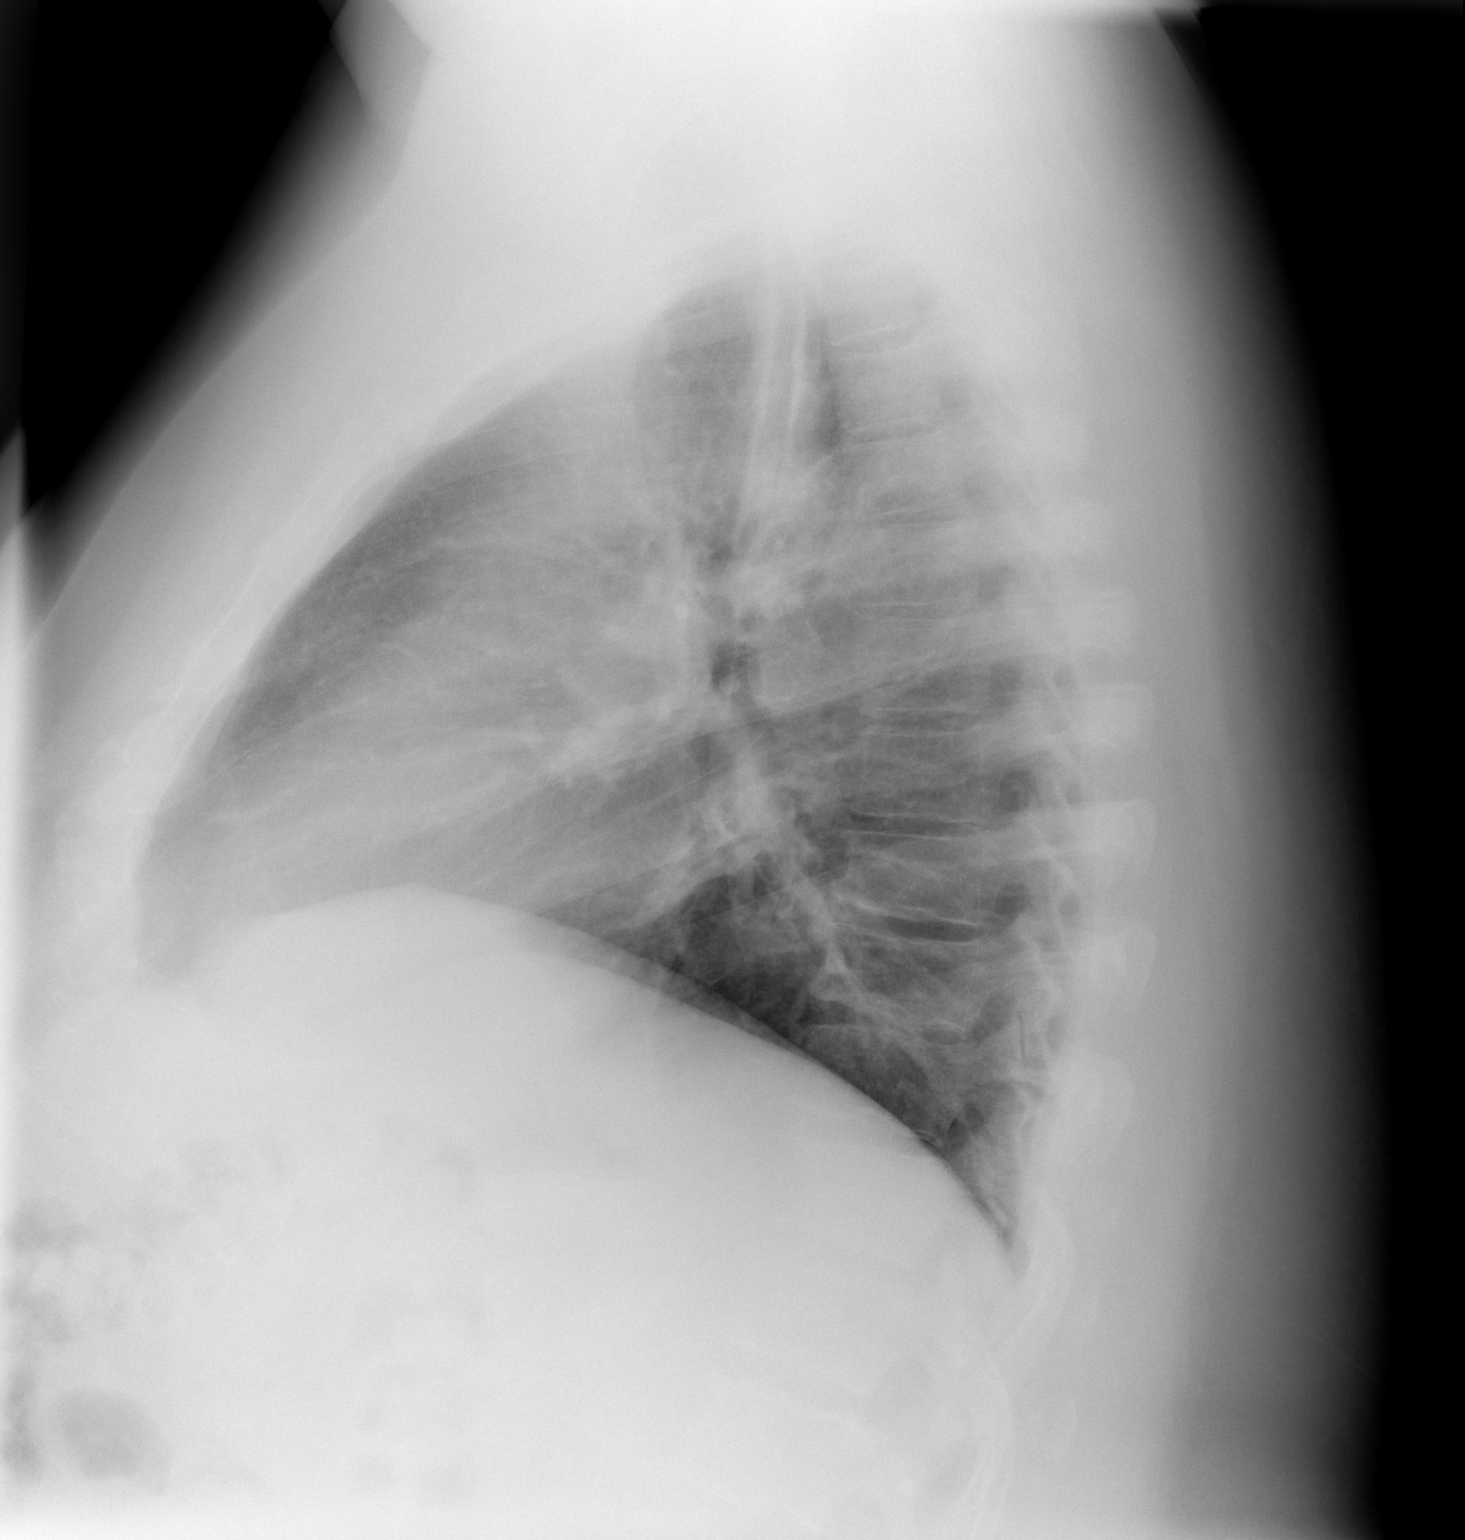

[2 of 2 positions shown; findings below may reference images not displayed]

FINDINGS: Normal heart size and mediastinal contours. No acute infiltrate or
edema. No effusion or pneumothorax. No acute osseous findings.
IMPRESSION: No active cardiopulmonary disease.

## 2015-08-27 ENCOUNTER — Emergency Department (HOSPITAL_COMMUNITY)
Admission: EM | Admit: 2015-08-27 | Discharge: 2015-08-27 | Disposition: A | Discharge status: Home / Self Care | Payer: Medicaid Other | Attending: Emergency Medicine | Admitting: Emergency Medicine

## 2015-08-27 ENCOUNTER — Encounter (HOSPITAL_COMMUNITY): Payer: Self-pay | Admitting: Oncology

## 2015-08-27 DIAGNOSIS — T781XXA Other adverse food reactions, not elsewhere classified, initial encounter: Secondary | ICD-10-CM | POA: Insufficient documentation

## 2015-08-27 DIAGNOSIS — Z8659 Personal history of other mental and behavioral disorders: Secondary | ICD-10-CM | POA: Insufficient documentation

## 2015-08-27 DIAGNOSIS — Y9389 Activity, other specified: Secondary | ICD-10-CM | POA: Insufficient documentation

## 2015-08-27 DIAGNOSIS — I10 Essential (primary) hypertension: Secondary | ICD-10-CM | POA: Insufficient documentation

## 2015-08-27 DIAGNOSIS — Y998 Other external cause status: Secondary | ICD-10-CM | POA: Diagnosis not present

## 2015-08-27 DIAGNOSIS — Y9289 Other specified places as the place of occurrence of the external cause: Secondary | ICD-10-CM | POA: Diagnosis not present

## 2015-08-27 DIAGNOSIS — X58XXXA Exposure to other specified factors, initial encounter: Secondary | ICD-10-CM | POA: Insufficient documentation

## 2015-08-27 DIAGNOSIS — Z79899 Other long term (current) drug therapy: Secondary | ICD-10-CM | POA: Insufficient documentation

## 2015-08-27 DIAGNOSIS — Z87891 Personal history of nicotine dependence: Secondary | ICD-10-CM | POA: Diagnosis not present

## 2015-08-27 DIAGNOSIS — Z792 Long term (current) use of antibiotics: Secondary | ICD-10-CM | POA: Insufficient documentation

## 2015-08-27 DIAGNOSIS — T7840XA Allergy, unspecified, initial encounter: Secondary | ICD-10-CM

## 2015-08-27 MED ORDER — PREDNISONE 20 MG PO TABS: 40.0000 mg | ORAL_TABLET | Freq: Every day | ORAL | Status: AC

## 2015-08-27 MED ORDER — EPINEPHRINE 0.3 MG/0.3ML IJ SOAJ: 0.3000 mg | Freq: Once | INTRAMUSCULAR | Status: AC | PRN

## 2015-08-27 MED ORDER — METHYLPREDNISOLONE SODIUM SUCC 125 MG IJ SOLR
125.0000 mg | Freq: Once | INTRAMUSCULAR | Status: AC
Start: 2015-08-27 — End: 2015-08-27
  Administered 2015-08-27: 125 mg via INTRAMUSCULAR
  Filled 2015-08-27: qty 2

## 2015-08-27 NOTE — ED Provider Notes (Signed)
CSN: [id number]     Arrival date & time 08/27/15  2101 History  By signing my name below, I, Nicholas Miles, attest that this documentation has been prepared under the direction and in the presence of [name], PA-C. Electronically Signed: Julien Miles, ED Scribe. 08/27/2015. 10:03 PM.     Chief Complaint  Patient presents with  . Allergic Reaction      The history is provided by the patient. No language interpreter was used.   HPI Comments: Nicholas Miles is a 44 y.o. male who presents to the Emergency Department complaining of sudden onset allergic reaction onset 2 hours ago. Pt is allergic to peanuts and states that he came in contact with walnuts after cleaning a pan then touching his mouth.  States initially afterwards he felt that he had some tongue swelling and difficulty swallowing, however that has since resolved.  Wife states that she did not notice swelling but felt he was rather having a panic attack.  Patient states he did take 53m benadryl PTA, states he feels better.  Currently no complaints of facial/throat swelling, tongue swelling, difficulty swallowing, SOB, or chest pain.  VSS.  Past Medical History  Diagnosis Date  . Asbestos exposure   . Hypertension   . ADD (attention deficit disorder with hyperactivity)   . Pleurisy    Past Surgical History  Procedure Laterality Date  . Hernia repair  2006   Family History  Problem Relation Age of Onset  . Multiple myeloma Mother   . Colon cancer Father    Social History  Substance Use Topics  . Smoking status: Former Smoker -- 0.30 packs/day for 7 years    Types: Cigarettes    Quit date: 11/25/2009  . Smokeless tobacco: Former [employer] developer   Quit date: 11/25/1988  . Alcohol Use: No    Review of Systems  Constitutional:       Allergic rxn  All other systems reviewed and are negative.     Allergies  Peanut butter flavor; Peanut-containing drug products; and Cephalexin  Home Medications   Prior to Admission  medications   Medication Sig Start Date End Date Taking? Authorizing Provider  acetaminophen (TYLENOL) 160 MG/5ML elixir Take 10.2 mLs (325 mg total) by mouth every 6 (six) hours as needed for pain. 01/26/14   Marissa Sciacca, PA-C  Ascorbic Acid (VITAMIN C) 1000 MG tablet Take 1,000 mg by mouth daily.    Historical Provider, MD  Aspirin-Acetaminophen-Caffeine (GOODY HEADACHE PO) Take 1 packet by mouth daily as needed (for pain).     Historical Provider, MD  azithromycin (ZITHROMAX) 250 MG tablet Take 250-500 mg by mouth daily. 5 day course started 01/21/14: take 2 tablets (500 mg) 1st day, then take 1 tablet (250 mg) every day for 4 days    Historical Provider, MD  Chlorphen-Pseudoephed-APAP (THERAFLU FLU/COLD PO) Take 1 packet by mouth every 6 (six) hours as needed (cold/flu symptoms).    Historical Provider, MD  EPINEPHrine (EPIPEN) 0.3 mg/0.3 mL SOAJ injection Inject 0.3 mg into the muscle as needed (allergic reaction).    Historical Provider, MD  ibuprofen (ADVIL,MOTRIN) 200 MG tablet Take 200-400 mg by mouth every 4 (four) hours as needed (pain).    Historical Provider, MD  Multiple Vitamin (MULTIVITAMIN WITH MINERALS) TABS tablet Take 1 tablet by mouth daily.    Historical Provider, MD  ZINC SULFATE PO Take 1 tablet by mouth daily.    Historical Provider, MD   Triage vitals: BP 161/92 mmHg  Pulse 107  Temp(Src) 97.9 F (36.6 C) (Oral)  Resp 19  Ht '6\' 1"'  (1.854 m)  Wt 370 lb (167.831 kg)  BMI 48.83 kg/m2  SpO2 96% Physical Exam  Constitutional: He is oriented to person, place, and time. He appears well-developed and well-nourished.  HENT:  Head: Normocephalic and atraumatic.  Mouth/Throat: Uvula is midline, oropharynx is clear and moist and mucous membranes are normal. No oropharyngeal exudate, posterior oropharyngeal edema, posterior oropharyngeal erythema or tonsillar abscesses.  No lip, tongue, or facial swelling; handling secretions well, no difficulty swallowing; no oral lesions;  airway widely patient, normal phonation, no stridor  Eyes: Conjunctivae and EOM are normal. Pupils are equal, round, and reactive to light.  Neck: Normal range of motion and full passive range of motion without pain. Neck supple. No rigidity.  No edema noted  Cardiovascular: Normal rate, regular rhythm and normal heart sounds.   Pulmonary/Chest: Effort normal and breath sounds normal. He has no wheezes. He has no rhonchi. He has no rales.  Lungs CTAB, no distress, speaking in full complete sentences without difficulty  Abdominal: Soft. Bowel sounds are normal.  Musculoskeletal: Normal range of motion.  Neurological: He is alert and oriented to person, place, and time.  Skin: Skin is warm and dry. No rash noted.  Psychiatric: He has a normal mood and affect.  Nursing note and vitals reviewed.   ED Course  Procedures  DIAGNOSTIC STUDIES: Oxygen Saturation is 96% on RA, adequate by my interpretation.  COORDINATION OF CARE:  10:17 PM Discussed treatment plan with pt at bedside and pt agreed to plan.  Labs Review Labs Reviewed - No data to display  Imaging Review No results found.    EKG Interpretation None      MDM   Final diagnoses:  Allergic reaction, initial encounter   44 year old male here for allegic reaction to nuts. Has a known allergy to Peanuts and actually came into contact with pan that had walnuts on it and touched his mouth.  He reports brief tongue swelling, however wife reports it appeared to be more of a panic attack. Patient states he feels back to baseline currently.  No airway compromise or respiratory symptoms noted on exam.  Patient has already had benadryl, will give solu-medrol and observe.  Patient observed additional hour in the ED, continues to feel at baseline.  Patient is now 3 hours post exposure without any recurrent symptoms.  Airway remains patent, no distress.  VSS.  Patient appears stable for discharge.  Will start on prednisone taper, continue  benadryl.  Given script for epi-pen if needed.  Discussed plan with patient, he/she acknowledged understanding and agreed with plan of care.  Return precautions given for new or worsening symptoms.  I personally performed the services described in this documentation, which was scribed in my presence. The recorded information has been reviewed and is accurate.  Larene Pickett, PA-C 08/27/15 Darci Needle  Virgel Manifold, MD 08/30/15 [phone]

## 2015-08-27 NOTE — Discharge Instructions (Signed)
Take the prescribed medication as directed.   Continue Benadryl very 6 hours as needed. Keep epi-pen on hand.  Use only for throat swelling/ loss of airway/ severe shortness of breath.  If used, you must come to ED for evaluation. Return to the ED for new or worsening symptoms.

## 2015-08-27 NOTE — ED Notes (Signed)
Per pt he was cleaning a pan that had peanuts on it.  Pt then touched his mouth and began to feel SOB, and swelling in the tongue.  Pt did not know where epi pen was so he took 50 mg of liquid benadryl.  Pt is speaking in full sentences in NAD.  No swelling noted to tongue noted in triage.
